# Patient Record
Sex: Female | Born: 2000 | Race: Black or African American | Hispanic: No | Marital: Single | Smoking: Never smoker
Health system: Southern US, Community
[De-identification: ages and names within clinical notes are randomized; demographics above are authoritative.]

## PROBLEM LIST (undated history)

## (undated) ENCOUNTER — Emergency Department (HOSPITAL_COMMUNITY): Admission: EM | Payer: BC Managed Care – HMO | Source: Home / Self Care

## (undated) ENCOUNTER — Inpatient Hospital Stay (HOSPITAL_COMMUNITY): Payer: Self-pay

## (undated) DIAGNOSIS — A749 Chlamydial infection, unspecified: Secondary | ICD-10-CM

## (undated) DIAGNOSIS — D696 Thrombocytopenia, unspecified: Secondary | ICD-10-CM

## (undated) HISTORY — PX: NO PAST SURGERIES: SHX2092

## (undated) HISTORY — DX: Thrombocytopenia, unspecified: D69.6

---
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
## 2018-01-20 ENCOUNTER — Telehealth: Payer: Self-pay

## 2018-01-20 MED ORDER — FLUCONAZOLE 150 MG PO TABS: 150.0000 mg | ORAL_TABLET | Freq: Once | ORAL | 0 refills | Status: AC

## 2018-01-20 NOTE — Telephone Encounter (Signed)
Pt called and stated she took an over the counter test for yeast infection on this past Sunday and it was positive. She tried using monistat with no improvement. Pt is currently in [place] in school which is 2 hours away and wants to know if something can be called in to the [facility] pharmacy there.    [facility] [address] [place], [place] [address]    Patient can be reached at [phone] 

## 2018-01-20 NOTE — Telephone Encounter (Signed)
Pt called and stated she took an over the counter test for yeast infection on this past Sunday and it was positive. She tried using monistat with no improvement. Pt is currently in [place] in school which is 2 hours away and wants to know if something can be called in to the [place] pharmacy there.    [facility] [address] [place], [place] [address]    Patient can be reached at [phone]

## 2018-04-08 ENCOUNTER — Ambulatory Visit: Admitting: Family Medicine

## 2018-04-13 ENCOUNTER — Telehealth: Payer: Self-pay | Admitting: Family Medicine

## 2018-04-13 NOTE — Telephone Encounter (Signed)
Dismissal letter in [employer] snapshot  °

## 2018-05-13 NOTE — L&D Delivery Note (Signed)
Delivery Note  Called to room for delivery. Twin A head was already delivered, I quickly put on gloves and delivered Twin A's body without difficulty. Infant vigorous from delivery, placed on mom's abdomen, cord clamped and cut. Palpated intact amniotic sac of Twin B in footling breech position. I grasped both feet and gently pulled caudad. While doing this, the amniotic sac ruptured to clear fluid. The feet and buttocks of Twin B were easily delivered. The hips were grasped with a towel and B was gently pulled down until the level of the shoulders was at the introitus. Infant was gently rotated until the arms could be swept forward and out. The infant's body was lifted at this point and the head delivered. Twin B with poor tone, cord immediately clamped and cut and infant taken to warmer. Cord blood collected for both. Placenta delivered spontaneously intact. Reviewed risks/benefits of Liletta IUD insertion with patient, consent previously signed. She again verbalized desire for IUD placement. Timeout performed and patient and procedure identified. Liletta strings trimmed and IUD placed at the fundus without difficulty. Strings trimmed again to 4 cm from cervix. 1st degree perineal laceration repaired with 2-0 vicryl in usual fashion. EBL 200 mL.     Gabriella Ingram, Gabriella Ingram [[id number]]  At 7:26 PM a viable female was delivered via Vaginal, Spontaneous (Presentation: vertex ;  ).  APGAR: 8, 9; weight 4 lb 15.2 oz (2245 g).   Placenta status: intact    Gabriella Ingram, Gabriella Ingram [[id number]]  At 7:32 PM a viable female was delivered via Vaginal, Breech (Presentation: ;  ).  APGAR: , ; weight 5 lb 1.1 oz (2299 g).   Placenta status: , .  Cord:  with the following complications: .  Anesthesia:   Episiotomy: None Lacerations: 1st degree;Periurethral;Perineal Suture Repair: 2.0 vicryl Est. Blood Loss (mL): 200   Mom to postpartum.   Baby A to Couplet care / Skin to Skin.   Baby B to Couplet care / Skin to  Skin.  Sloan Leiter 11/08/2018, 10:06 PM

## 2018-07-01 DIAGNOSIS — Z3400 Encounter for supervision of normal first pregnancy, unspecified trimester: Secondary | ICD-10-CM | POA: Insufficient documentation

## 2018-07-02 ENCOUNTER — Other Ambulatory Visit: Payer: Self-pay

## 2018-07-02 ENCOUNTER — Ambulatory Visit (INDEPENDENT_AMBULATORY_CARE_PROVIDER_SITE_OTHER): Payer: BLUE CROSS/BLUE SHIELD | Admitting: Obstetrics and Gynecology

## 2018-07-02 ENCOUNTER — Encounter: Payer: Self-pay | Admitting: Obstetrics and Gynecology

## 2018-07-02 DIAGNOSIS — Z3A17 17 weeks gestation of pregnancy: Secondary | ICD-10-CM

## 2018-07-02 DIAGNOSIS — N898 Other specified noninflammatory disorders of vagina: Secondary | ICD-10-CM | POA: Diagnosis not present

## 2018-07-02 DIAGNOSIS — O30049 Twin pregnancy, dichorionic/diamniotic, unspecified trimester: Secondary | ICD-10-CM | POA: Insufficient documentation

## 2018-07-02 DIAGNOSIS — O30042 Twin pregnancy, dichorionic/diamniotic, second trimester: Secondary | ICD-10-CM

## 2018-07-02 DIAGNOSIS — Z113 Encounter for screening for infections with a predominantly sexual mode of transmission: Secondary | ICD-10-CM

## 2018-07-02 DIAGNOSIS — O0992 Supervision of high risk pregnancy, unspecified, second trimester: Secondary | ICD-10-CM

## 2018-07-02 MED ORDER — ASPIRIN EC 81 MG PO TBEC
81.0000 mg | DELAYED_RELEASE_TABLET | Freq: Every day | ORAL | 2 refills | Status: DC
Start: 2018-07-02 — End: 2021-06-25

## 2018-07-02 NOTE — Progress Notes (Signed)
Subjective:  Gabriella Ingram is a 18 y.o. G1P0 at 4w4dbeing seen today for her first OB visit. EDD by first trimester U/S. Twin gestation, Di/Di by early U/S. No chronic medical problems or medications.   She is currently monitored for the following issues for this high-risk pregnancy and has Dichorionic diamniotic twin gestation on their problem list.  Patient reports general discomforts of twin gestation.  Contractions: Not present. Vag. Bleeding: None.   . Denies leaking of fluid.   The following portions of the patient's history were reviewed and updated as appropriate: allergies, current medications, past family history, past medical history, past social history, past surgical history and problem list. Problem list updated.  Objective:   Vitals:   07/02/18 1430  BP: 117/68  Pulse: 90  Temp: (!) 97 F (36.1 C)  Weight: 144 lb 14.4 oz (65.7 kg)    Fetal Status: Fetal Heart Rate (bpm): A:149/B:156         General:  Alert, oriented and cooperative. Patient is in no acute distress.  Skin: Skin is warm and dry. No rash noted.   Cardiovascular: Normal heart rate noted  Respiratory: Normal respiratory effort, no problems with respiration noted  Abdomen: Soft, gravid, appropriate for gestational age. Pain/Pressure: Present     Pelvic:  Cervical exam performed        Extremities: Normal range of motion.  Edema: None  Mental Status: Normal mood and affect. Normal behavior. Normal judgment and thought content.  Breast sym supple no masses, nipple discharge or adenopathy  Urinalysis:      Assessment and Plan:  Pregnancy: G1P0 at 155w4d1. Supervision of high risk pregnancy in second trimester Prenatal care and labs reviewed with pt. Genetic testing and potential cost reviewed. - Enroll Patient in Babyscripts - Obstetric Panel, Including HIV - Hemoglobinopathy evaluation - Cystic Fibrosis Mutation 97 - Culture, OB Urine - Genetic Screening - SMN1 COPY NUMBER ANALYSIS (SMA Carrier  Screen) - AFP, Serum, Open Spina Bifida - Cervicovaginal ancillary only( [place]) - TSH - Protein / creatinine ratio, urine - Comp Met (CMET) - aspirin EC 81 MG tablet; Take 1 tablet (81 mg total) by mouth daily. Take after 12 weeks for prevention of preeclampsia later in pregnancy  Dispense: 300 tablet; Refill: 2 - [place] OB DETAIL +14 WK; Future  2. Dichorionic diamniotic twin pregnancy in second trimester Twin gestation pregnancy reviewed with pt.  - Enroll Patient in Babyscripts - Obstetric Panel, Including HIV - Hemoglobinopathy evaluation - Cystic Fibrosis Mutation 97 - Culture, OB Urine - Genetic Screening - SMN1 COPY NUMBER ANALYSIS (SMA Carrier Screen) - AFP, Serum, Open Spina Bifida - Cervicovaginal ancillary only( [place]) - TSH - Protein / creatinine ratio, urine - Comp Met (CMET) - aspirin EC 81 MG tablet; Take 1 tablet (81 mg total) by mouth daily. Take after 12 weeks for prevention of preeclampsia later in pregnancy  Dispense: 300 tablet; Refill: 2 - [place] OB DETAIL +14 WK; Future  Preterm labor symptoms and general obstetric precautions including but not limited to vaginal bleeding, contractions, leaking of fluid and fetal movement were reviewed in detail with the patient. Please refer to After Visit Summary for other counseling recommendations.  Return in about 3 weeks (around 07/23/2018) for OB visit.   ErChancy MilroyMD

## 2018-07-02 NOTE — Patient Instructions (Signed)
Second Trimester of Pregnancy The second trimester is from week 14 through week 27 (months 4 through 6). The second trimester is often a time when you feel your best. Your body has adjusted to being pregnant, and you begin to feel better physically. Usually, morning sickness has lessened or quit completely, you may have more energy, and you may have an increase in appetite. The second trimester is also a time when the fetus is growing rapidly. At the end of the sixth month, the fetus is about 9 inches long and weighs about 1 pounds. You will likely begin to feel the baby move (quickening) between 16 and 20 weeks of pregnancy. Body changes during your second trimester Your body continues to go through many changes during your second trimester. The changes vary from woman to woman.  Your weight will continue to increase. You will notice your lower abdomen bulging out.  You may begin to get stretch marks on your hips, abdomen, and breasts.  You may develop headaches that can be relieved by medicines. The medicines should be approved by your health care provider.  You may urinate more often because the fetus is pressing on your bladder.  You may develop or continue to have heartburn as a result of your pregnancy.  You may develop constipation because certain hormones are causing the muscles that push waste through your intestines to slow down.  You may develop hemorrhoids or swollen, bulging veins (varicose veins).  You may have back pain. This is caused by: ? Weight gain. ? Pregnancy hormones that are relaxing the joints in your pelvis. ? A shift in weight and the muscles that support your balance.  Your breasts will continue to grow and they will continue to become tender.  Your gums may bleed and may be sensitive to brushing and flossing.  Dark spots or blotches (chloasma, mask of pregnancy) may develop on your face. This will likely fade after the baby is born.  A dark line from your  belly button to the pubic area (linea nigra) may appear. This will likely fade after the baby is born.  You may have changes in your hair. These can include thickening of your hair, rapid growth, and changes in texture. Some women also have hair loss during or after pregnancy, or hair that feels dry or thin. Your hair will most likely return to normal after your baby is born. What to expect at prenatal visits During a routine prenatal visit:  You will be weighed to make sure you and the fetus are growing normally.  Your blood pressure will be taken.  Your abdomen will be measured to track your baby's growth.  The fetal heartbeat will be listened to.  Any test results from the previous visit will be discussed. Your health care provider may ask you:  How you are feeling.  If you are feeling the baby move.  If you have had any abnormal symptoms, such as leaking fluid, bleeding, severe headaches, or abdominal cramping.  If you are using any tobacco products, including cigarettes, chewing tobacco, and electronic cigarettes.  If you have any questions. Other tests that may be performed during your second trimester include:  Blood tests that check for: ? Low iron levels (anemia). ? High blood sugar that affects pregnant women (gestational diabetes) between 24 and 28 weeks. ? Rh antibodies. This is to check for a protein on red blood cells (Rh factor).  Urine tests to check for infections, diabetes, or protein in the   urine.  An ultrasound to confirm the proper growth and development of the baby.  An amniocentesis to check for possible genetic problems.  Fetal screens for spina bifida and Down syndrome.  HIV (human immunodeficiency virus) testing. Routine prenatal testing includes screening for HIV, unless you choose not to have this test. Follow these instructions at home: Medicines  Follow your health care provider's instructions regarding medicine use. Specific medicines may be  either safe or unsafe to take during pregnancy.  Take a prenatal vitamin that contains at least 600 micrograms (mcg) of folic acid.  If you develop constipation, try taking a stool softener if your health care provider approves. Eating and drinking   Eat a balanced diet that includes fresh fruits and vegetables, whole grains, good sources of protein such as meat, eggs, or tofu, and low-fat dairy. Your health care provider will help you determine the amount of weight gain that is right for you.  Avoid raw meat and uncooked cheese. These carry germs that can cause birth defects in the baby.  If you have low calcium intake from food, talk to your health care provider about whether you should take a daily calcium supplement.  Limit foods that are high in fat and processed sugars, such as fried and sweet foods.  To prevent constipation: ? Drink enough fluid to keep your urine clear or pale yellow. ? Eat foods that are high in fiber, such as fresh fruits and vegetables, whole grains, and beans. Activity  Exercise only as directed by your health care provider. Most women can continue their usual exercise routine during pregnancy. Try to exercise for 30 minutes at least 5 days a week. Stop exercising if you experience uterine contractions.  Avoid heavy lifting, wear low heel shoes, and practice good posture.  A sexual relationship may be continued unless your health care provider directs you otherwise. Relieving pain and discomfort  Wear a good support bra to prevent discomfort from breast tenderness.  Take warm sitz baths to soothe any pain or discomfort caused by hemorrhoids. Use hemorrhoid cream if your health care provider approves.  Rest with your legs elevated if you have leg cramps or low back pain.  If you develop varicose veins, wear support hose. Elevate your feet for 15 minutes, 3-4 times a day. Limit salt in your diet. Prenatal Care  Write down your questions. Take them to  your prenatal visits.  Keep all your prenatal visits as told by your health care provider. This is important. Safety  Wear your seat belt at all times when driving.  Make a list of emergency phone numbers, including numbers for family, friends, the hospital, and police and fire departments. General instructions  Ask your health care provider for a referral to a local prenatal education class. Begin classes no later than the beginning of month 6 of your pregnancy.  Ask for help if you have counseling or nutritional needs during pregnancy. Your health care provider can offer advice or refer you to specialists for help with various needs.  Do not use hot tubs, steam rooms, or saunas.  Do not douche or use tampons or scented sanitary pads.  Do not cross your legs for long periods of time.  Avoid cat litter boxes and soil used by cats. These carry germs that can cause birth defects in the baby and possibly loss of the fetus by miscarriage or stillbirth.  Avoid all smoking, herbs, alcohol, and unprescribed drugs. Chemicals in these products can affect the formation   and growth of the baby.  Do not use any products that contain nicotine or tobacco, such as cigarettes and e-cigarettes. If you need help quitting, ask your health care provider.  Visit your dentist if you have not gone yet during your pregnancy. Use a soft toothbrush to brush your teeth and be gentle when you floss. Contact a health care provider if:  You have dizziness.  You have mild pelvic cramps, pelvic pressure, or nagging pain in the abdominal area.  You have persistent nausea, vomiting, or diarrhea.  You have a bad smelling vaginal discharge.  You have pain when you urinate. Get help right away if:  You have a fever.  You are leaking fluid from your vagina.  You have spotting or bleeding from your vagina.  You have severe abdominal cramping or pain.  You have rapid weight gain or weight loss.  You have  shortness of breath with chest pain.  You notice sudden or extreme swelling of your face, hands, ankles, feet, or legs.  You have not felt your baby move in over an hour.  You have severe headaches that do not go away when you take medicine.  You have vision changes. Summary  The second trimester is from week 14 through week 27 (months 4 through 6). It is also a time when the fetus is growing rapidly.  Your body goes through many changes during pregnancy. The changes vary from woman to woman.  Avoid all smoking, herbs, alcohol, and unprescribed drugs. These chemicals affect the formation and growth your baby.  Do not use any tobacco products, such as cigarettes, chewing tobacco, and e-cigarettes. If you need help quitting, ask your health care provider.  Contact your health care provider if you have any questions. Keep all prenatal visits as told by your health care provider. This is important. This information is not intended to replace advice given to you by your health care provider. Make sure you discuss any questions you have with your health care provider. Document Released: 04/23/2001 Document Revised: 06/04/2016 Document Reviewed: 06/04/2016 Elsevier Interactive Patient Education  2019 [employer]. Multiple Pregnancy Having a multiple pregnancy means that a woman is carrying more than one baby at a time. She may be pregnant with twins, triplets, or more. The majority of multiple pregnancies are twins. Naturally conceiving triplets or more (higher-order multiples) is rare. Multiple pregnancies are riskier than single pregnancies. A woman with a multiple pregnancy is more likely to have certain problems during her pregnancy. Therefore, she will need to have more frequent appointments for prenatal care. How does a multiple pregnancy happen? A multiple pregnancy happens when:  The woman's body releases more than one egg at a time, and then each egg gets fertilized by a different  sperm. ? This is the most common type of multiple pregnancy. ? Twins or other multiples produced this way are fraternal. They are no more alike than non-multiple siblings are.  One sperm fertilizes one egg, which then divides into more than one embryo. ? Twins or other multiples produced this way are identical. Identical multiples are always the same gender, and they look very much alike. Who is most likely to have a multiple pregnancy? A multiple pregnancy is more likely to develop in women who:  Have had fertility treatment, especially if the treatment included fertility drugs.  Are older than 20 years of age.  Have already had four or more children.  Have a family history of multiple pregnancy. How is a  multiple pregnancy diagnosed? A multiple pregnancy may be diagnosed based on:  Symptoms such as: ? Rapid weight gain in the first 3 months of pregnancy (first trimester). ? More severe nausea and breast tenderness than what is typical of a single pregnancy. ? The uterus measuring larger than what is normal for the stage of the pregnancy.  Blood tests that detect a higher-than-normal level of human chorionic gonadotropin (hCG). This is a hormone that your body produces in early pregnancy.  Ultrasound exam. This is used to confirm that you are carrying multiples. What risks are associated with multiple pregnancy? A multiple pregnancy puts you at a higher risk for certain problems during or after your pregnancy, including:  Having your babies delivered before you have reached a full-term pregnancy (preterm birth). A full-term pregnancy lasts for at least 37 weeks. Babies born before 37 weeks may have a higher risk of a variety of health problems, such as breathing problems, feeding difficulties, cerebral palsy, and learning disabilities.  Diabetes.  Preeclampsia. This is a serious condition that causes high blood pressure along with other symptoms, such as swelling and headaches,  during pregnancy.  Excessive blood loss after childbirth (postpartum hemorrhage).  Postpartum depression.  Low birth weight of the babies. How will having a multiple pregnancy affect my care? Your health care provider will want to monitor you more closely during your pregnancy to make sure that your babies are growing normally and that you are healthy. Follow these instructions at home: Because your pregnancy is considered to be high risk, you will need to work closely with your health care team. You may also need to make some lifestyle changes. These may include the following: Eating and drinking  Increase your nutrition. ? Follow your health care provider's recommendations for weight gain. You may need to gain a little extra weight when you are pregnant with multiples. ? Eat healthy snacks often throughout the day. This can add calories and reduce nausea.  Drink enough fluid to keep your urine pale yellow.  Take prenatal vitamins. Activity By 20-24 weeks, you may need to limit your activities.  Avoid activities and work that take a lot of effort (are strenuous).  Ask your health care provider when you should stop having sexual intercourse.  Rest often. General instructions  Do not use any products that contain nicotine or tobacco, such as cigarettes and e-cigarettes. If you need help quitting, ask your health care provider.  Do not drink alcohol or use illegal drugs.  Take over-the-counter and prescription medicines only as told by your health care provider.  Arrange for extra help around the house.  Keep all follow-up visits and all prenatal visits as told by your health care provider. This is important. Contact a health care provider if:  You have dizziness.  You have persistent nausea, vomiting, or diarrhea.  You are having trouble gaining weight.  You have feelings of depression or other emotions that are interfering with your normal activities. Get help right  away if:  You have a fever.  You have pain with urination.  You have fluid leaking from your vagina.  You have a bad-smelling vaginal discharge.  You notice increased swelling in your face, hands, legs, or ankles.  You have spotting or bleeding from your vagina.  You have pelvic cramps, pelvic pressure, or nagging pain in your abdomen or lower back.  You are having regular contractions.  You develop a severe headache, with or without visual changes.  You have shortness  of breath or chest pain.  You notice less fetal movement, or no fetal movement. Summary  Having a multiple pregnancy means that a woman is carrying more than one baby at a time.  A multiple pregnancy puts you at a higher risk for certain problems during and after your pregnancy, such as: having your babies delivered before you have reached a full-term pregnancy (preterm birth), diabetes, preeclampsia, excessive blood loss after childbirth (postpartum hemorrhage), postpartum depression, or low birth weight of the babies.  Your health care provider will want to monitor you more closely during your pregnancy to make sure that your babies are growing normally and that you are healthy.  You may need to make some lifestyle changes during pregnancy, including: increasing your nutrition, limiting your activities after 20-24 weeks of pregnancy, and arranging for extra help around the house. This information is not intended to replace advice given to you by your health care provider. Make sure you discuss any questions you have with your health care provider. Document Released: 02/06/2008 Document Revised: 01/22/2017 Document Reviewed: 12/29/2015 Elsevier Interactive Patient Education  [employer].

## 2018-07-02 NOTE — Progress Notes (Signed)
New OB with twins.  Early Ultrasound was done 05/25/2018.  C/o back pains 7/10 and nausea.

## 2018-07-03 ENCOUNTER — Encounter: Payer: Self-pay | Admitting: Obstetrics and Gynecology

## 2018-07-03 LAB — CERVICOVAGINAL ANCILLARY ONLY
Bacterial vaginitis: NEGATIVE
Candida vaginitis: NEGATIVE
Chlamydia: POSITIVE — AB
Neisseria Gonorrhea: NEGATIVE
TRICH (WINDOWPATH): NEGATIVE

## 2018-07-04 LAB — CULTURE, OB URINE

## 2018-07-04 LAB — URINE CULTURE, OB REFLEX

## 2018-07-06 ENCOUNTER — Encounter (HOSPITAL_COMMUNITY): Payer: Self-pay

## 2018-07-06 ENCOUNTER — Inpatient Hospital Stay (HOSPITAL_COMMUNITY)
Admission: AD | Admit: 2018-07-06 | Discharge: 2018-07-06 | Disposition: A | Discharge status: Home / Self Care | Payer: BLUE CROSS/BLUE SHIELD | Attending: Obstetrics & Gynecology | Admitting: Obstetrics & Gynecology

## 2018-07-06 DIAGNOSIS — O98312 Other infections with a predominantly sexual mode of transmission complicating pregnancy, second trimester: Secondary | ICD-10-CM | POA: Insufficient documentation

## 2018-07-06 DIAGNOSIS — A749 Chlamydial infection, unspecified: Secondary | ICD-10-CM | POA: Diagnosis not present

## 2018-07-06 DIAGNOSIS — O30042 Twin pregnancy, dichorionic/diamniotic, second trimester: Secondary | ICD-10-CM

## 2018-07-06 DIAGNOSIS — A568 Sexually transmitted chlamydial infection of other sites: Secondary | ICD-10-CM | POA: Diagnosis not present

## 2018-07-06 DIAGNOSIS — O98812 Other maternal infectious and parasitic diseases complicating pregnancy, second trimester: Principal | ICD-10-CM

## 2018-07-06 DIAGNOSIS — Z3A18 18 weeks gestation of pregnancy: Secondary | ICD-10-CM | POA: Diagnosis not present

## 2018-07-06 HISTORY — DX: Chlamydial infection, unspecified: A74.9

## 2018-07-06 MED ORDER — AZITHROMYCIN 500 MG PO TABS: 1000.0000 mg | ORAL_TABLET | Freq: Once | ORAL | 0 refills | Status: AC

## 2018-07-06 MED ORDER — ONDANSETRON 4 MG PO TBDP: 4.0000 mg | ORAL_TABLET | Freq: Once | ORAL | 0 refills | Status: AC

## 2018-07-06 MED ORDER — AZITHROMYCIN 250 MG PO TABS
1000.0000 mg | ORAL_TABLET | Freq: Once | ORAL | Status: AC
  Administered 2018-07-06: 1000 mg via ORAL
  Filled 2018-07-06: qty 4

## 2018-07-06 NOTE — Telephone Encounter (Signed)
Patient called in to advise that [facility] advise her to contact OBGYN office for 2nd Tx of Abx for Chlamydia since she vomited up 1st round.  Advised patient I would electronically send to [facility]:   Ondansetron (Zofran) 4 mg disintegrating sublingual Azithromycin (Zithromax) 1 gram  Advised patient to take the Zofran first then she can crush the Azithromycin up - mix in Apple Sauce  - making sure she take ALL of antibiotic. Patient advised she understood and would.

## 2018-07-06 NOTE — Discharge Instructions (Signed)
Chlamydia, Female  Chlamydia is an STD (sexually transmitted disease). It is a bacterial infection that spreads (is contagious) through sexual contact. Chlamydia can occur in different areas of the body, including:   The tube that moves urine from the bladder out of the body (urethra).   The lower part of the uterus (cervix).   The throat.   The rectum.  This condition is not difficult to treat. However, if left untreated, chlamydia can lead to more serious health problems, including pelvic inflammatory disorder (PID). PID can increase your risk of not being able to have children (sterility). Also, if chlamydia is left untreated and you are pregnant or become pregnant, there is a chance that your baby can become infected during delivery. This may cause serious health problems for the baby.  What are the causes?  Chlamydia is caused by the bacteria Chlamydia trachomatis. It is passed from an infected partner during sexual activity. Chlamydia can spread through contact with the genitals, mouth, or rectum.  What are the signs or symptoms?  In some cases, there may not be any symptoms for this condition (asymptomatic), especially early in the infection. If symptoms develop, they may include:   Burning with urination.   Frequent urination.   Vaginal discharge.   Redness, soreness, and swelling (inflammation) of the rectum.   Bleeding or discharge from the rectum.   Abdominal pain.   Pain during sexual intercourse.   Bleeding between menstrual periods.   Itching, burning, or redness in the eyes, or discharge from the eyes.  How is this diagnosed?  This condition may be diagnosed with:   Urine tests.   Swab tests. Depending on your symptoms, your health care provider may use a cotton swab to collect discharge from your vagina or rectum to test for the bacteria.   A pelvic exam.  How is this treated?  This condition is treated with antibiotic medicines. If you are pregnant, certain types of antibiotics will  need to be avoided.  Follow these instructions at home:  Medicines   Take over-the-counter and prescription medicines only as told by your health care provider.   Take your antibiotic medicine as told by your health care provider. Do not stop taking the antibiotic even if you start to feel better.  Sexual activity   Tell sexual partners about your infection. This includes any oral, anal, or vaginal sex partners you have had within 60 days of when your symptoms started. Sexual partners should also be treated, even if they have no signs of the disease.   Do not have sex until you and your sexual partners have completed treatment and your health care provider says it is okay. If your health care provider prescribed you a single dose treatment, wait 7 days after taking the treatment before having sex.  General instructions   It is your responsibility to get your test results. Ask your health care provider, or the department performing the test, when your results will be ready.   Get plenty of rest.   Eat a healthy, well-balanced diet.   Drink enough fluids to keep your urine clear or pale yellow.   Keep all follow-up visits as told by your health care provider. This is important. You may need to be tested for infection again 3 months after treatment.  How is this prevented?  The only sure way to prevent chlamydia is to avoid having sex. However, you can lower your risk by:   Using latex condoms correctly every   time you have sex.   Not having multiple sexual partners.   Asking if your sexual partner has been tested for STIs and had negative results.  Contact a health care provider if:   You develop new symptoms or your symptoms do not get better after completing treatment.   You have a fever or chills.   You have pain during sexual intercourse.  Get help right away if:   Your pain gets worse and does not get better with medicine.   You develop flu-like symptoms, such as night sweats, sore throat, or  muscle aches.   You experience nausea or vomiting.   You have difficulty swallowing.   You have bleeding between periods or after sex.   You have irregular menstrual periods.   You have abdominal or lower back pain that does not get better with medicine.   You feel weak or dizzy, or you faint.   You are pregnant and you develop symptoms of chlamydia.  Summary   Chlamydia is an STD (sexually transmitted disease). It is a bacterial infection that spreads (is contagious) through sexual contact.   This condition is not difficult to treat, however. If left untreated, chlamydia can lead to more serious health problems, including pelvic inflammatory disease (PID).   In some cases, there may not be any symptoms for this condition (asymptomatic).   This condition is treated with antibiotic medicines.   Using latex condoms correctly every time you have sex can help prevent chlamydia.  This information is not intended to replace advice given to you by your health care provider. Make sure you discuss any questions you have with your health care provider.  Document Released: 02/06/2005 Document Revised: 10/21/2017 Document Reviewed: 04/15/2016  Elsevier Interactive Patient Education  2019 [employer]

## 2018-07-06 NOTE — MAU Provider Note (Signed)
Chief Complaint: Abdominal Pain   First Provider Initiated Contact with Patient 07/06/18 1000     SUBJECTIVE HPI: Gabriella Ingram is a 18 y.o. G1P0 at [redacted]w[redacted]d with di/di twins who presents to M[facility] Admissions requesting chlamydia treatment. Saw on her mychart that her recent testing was positive for chlamydia. States she called the office & was told she needed to come to MAU.  Reports lower abdominal cramping. States this has been the same abdominal cramping since the beginning of the pregnancy. Pain has not changed. States she is only here for chlamydia treatment.  Denies vaginal discharge, LOF, or vaginal bleeding.   Location: lower abdomen Quality: cramping Severity: 5/10 on pain scale Duration: 4 months Timing: intermittent Modifying factors: none Associated signs and symptoms: none  Past Medical History:  Diagnosis Date  . Chlamydia    this pregnancy  . 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 NO PAST SURGERIES     Social History   Socioeconomic History  . Marital status: Single    Spouse name: Not on file  . Number of children: Not on file  . Years of education: Not on file  . Highest education level: Not on file  Occupational History  . Occupation: unemployed  Social Needs  . Financial resource strain: Not on file  . Food insecurity:    Worry: Not on file    Inability: Not on file  . Transportation needs:    Medical: Not on file    Non-medical: Not on file  Tobacco Use  . Smoking status: Never Smoker  . Smokeless tobacco: Never Used  Substance and Sexual Activity  . Alcohol use: No  . Drug use: No  . Sexual activity: Yes    Birth control/protection: OCP, None  Lifestyle  . Physical activity:    Days  per week: Not on file    Minutes per session: Not on file  . Stress: Not on file  Relationships  . Social connections:    Talks on phone: Not on file    Gets together: Not on file    Attends religious service: Not on file    Active member of club or organization: Not on file    Attends meetings of clubs or organizations: Not on file    Relationship status: Not on file  . Intimate partner violence:    Fear of current or ex partner: Not on file    Emotionally abused: Not on file    Physically abused: Not on file    Forced sexual activity: Not on file  Other Topics Concern  . Not on file  Social History Narrative  . Not on file   Family History  Problem Relation Age of Onset  . Anxiety disorder Mother   . Depression Mother   . Cancer Mother   . Miscarriages / [place] Mother   . Arthritis Maternal Grandmother   . Hypertension Maternal Grandmother   . Arthritis Maternal Grandfather   . Diabetes Maternal Grandfather   . Cancer Maternal Grandfather   . Hypertension Maternal Grandfather  No current facility-administered medications on file prior to encounter.    Current Outpatient Medications on File Prior to Encounter  Medication Sig Dispense Refill  . aspirin EC 81 MG tablet Take 1 tablet (81 mg total) by mouth daily. Take after 12 weeks for prevention of preeclampsia later in pregnancy 300 tablet 2   Allergies  Allergen Reactions  . Other     Food allergy -tomatoes- rash, hives, SOB Dove soap- rash    I have reviewed patient's Past Medical Hx, Surgical Hx, Family Hx, Social Hx, medications and allergies.   Review of Systems  Constitutional: Negative.   Gastrointestinal: Positive for abdominal pain.  Genitourinary: Negative.     OBJECTIVE Patient Vitals for the past 24 hrs:  BP Temp Temp src Pulse Resp SpO2 Height Weight  07/06/18 0952 123/74 97.9 F (36.6 C) Oral 89 18 100 % - -  07/06/18 4287 - - - - - - 5\' 6"  (1.676 m) 64.9 kg   Constitutional:  Well-developed, well-nourished female in no acute distress.  Cardiovascular: normal rate & rhythm, no murmur Respiratory: normal rate and effort. Lung sounds clear throughout GI: Abd soft, non-tender, Pos BS x 4. No guarding or rebound tenderness MS: Extremities nontender, no edema, normal ROM Neurologic: Alert and oriented x 4.  GU: pt declines exam    LAB RESULTS No results found for this or any previous visit (from the past 24 hour(s)).  IMAGING No results found.  MAU COURSE Orders Placed This Encounter  Procedures  . Discharge patient   Meds ordered this encounter  Medications  . azithromycin (ZITHROMAX) tablet 1,000 mg    MDM Unable to doppler 2 FHTs Pt informed that the ultrasound is considered a limited OB ultrasound and is not intended to be a complete ultrasound exam.  Patient also informed that the ultrasound is not being completed with the intent of assessing for fetal or placental anomalies or any pelvic abnormalities.  Explained that the purpose of today's ultrasound is to assess for  viability.  Patient acknowledges the purpose of the exam and the limitations of the study.   Live IUP x 2 with FHT 160/152 BPM  Pelvic exam deferred. Pt with no new complaints & declines exam  Chlamydia tx given in MAU, azithromycin 1 gm PO. Pt declines EPT but states she did notify her partner to seek treatment.   ASSESSMENT 1. Chlamydia infection affecting pregnancy in second trimester   2. [redacted] weeks gestation of pregnancy   3. Dichorionic diamniotic twin pregnancy in second trimester     PLAN Discharge home in stable condition. No intercourse x 2 weeks  Allergies as of 07/06/2018      Reactions   Other    Food allergy -tomatoes- rash, hives, SOB Dove soap- rash      Medication List    TAKE these medications   aspirin EC 81 MG tablet Take 1 tablet (81 mg total) by mouth daily. Take after 12 weeks for prevention of preeclampsia later in pregnancy        Judeth Horn, NP 07/06/2018  11:09 AM

## 2018-07-06 NOTE — MAU Note (Signed)
Urine sent to lab 

## 2018-07-06 NOTE — MAU Note (Signed)
Pt had NOB appt on 2/20. Pt states she has been having mild cramping since she found out she was pregnant.  Pt found out that she had chlamydia yesterday through mychart.  She called her MD & was told that she needed to come in.

## 2018-07-08 ENCOUNTER — Telehealth: Payer: Self-pay

## 2018-07-08 NOTE — Telephone Encounter (Signed)
Patient called in upset, requesting to speak to me - wanting me to explain Chlamydia Tx process and education - reporting boyfriend called her as well as sent a text showing he did not have Chlamydia. Educated patient on  STD Tx process and how this infection is transmitted - sexually.   Patient calmed down - thanked me  - felt a little better. Patient verified/confirmed she picked up ABx yesterday (07/07/2018) - took all medication yesterday evening(mixed w/applesauce) and slept very well last night without any pain or cramping!! She thanked me again for everything!!! I stated I was glad I could help calm her nerves!!!

## 2018-07-13 LAB — AFP, SERUM, OPEN SPINA BIFIDA
AFP MOM: 2.08
AFP VALUE AFPOSL: 93.2 ng/mL
Gest. Age on Collection Date: 17.4 weeks
MATERNAL AGE AT EDD: 20.3 a
OSBR Risk 1 IN: 2880
TEST RESULTS AFP: NEGATIVE
WEIGHT: 145 [lb_av]

## 2018-07-13 LAB — COMPREHENSIVE METABOLIC PANEL
A/G RATIO: 1.3 (ref 1.2–2.2)
ALBUMIN: 3.9 g/dL (ref 3.9–5.0)
ALT: 11 IU/L (ref 0–32)
AST: 22 IU/L (ref 0–40)
Alkaline Phosphatase: 50 IU/L (ref 39–117)
BUN / CREAT RATIO: 6 — AB (ref 9–23)
BUN: 4 mg/dL — AB (ref 6–20)
Bilirubin Total: 0.3 mg/dL (ref 0.0–1.2)
CALCIUM: 9.3 mg/dL (ref 8.7–10.2)
CHLORIDE: 105 mmol/L (ref 96–106)
CO2: 20 mmol/L (ref 20–29)
CREATININE: 0.64 mg/dL (ref 0.57–1.00)
GFR calc Af Amer: 150 mL/min/{1.73_m2} (ref >59)
GFR calc non Af Amer: 130 mL/min/{1.73_m2} (ref >59)
Globulin, Total: 3.1 g/dL (ref 1.5–4.5)
Glucose: 72 mg/dL (ref 65–99)
Potassium: 4.7 mmol/L (ref 3.5–5.2)
SODIUM: 141 mmol/L (ref 134–144)
Total Protein: 7 g/dL (ref 6.0–8.5)

## 2018-07-13 LAB — HEMOGLOBINOPATHY EVALUATION
HEMOGLOBIN A2 QUANTITATION: 2.7 % (ref 1.8–3.2)
HEMOGLOBIN F QUANTITATION: 0 % (ref 0.0–2.0)
HGB C: 0 %
HGB S: 0 %
HGB VARIANT: 0 %
Hgb A: 97.3 % (ref 96.4–98.8)

## 2018-07-13 LAB — OBSTETRIC PANEL, INCLUDING HIV
Antibody Screen: NEGATIVE
Basophils Absolute: 0 10*3/uL (ref 0.0–0.2)
Basos: 0 %
EOS (ABSOLUTE): 0 10*3/uL (ref 0.0–0.4)
Eos: 1 %
HEP B S AG: NEGATIVE
HIV Screen 4th Generation wRfx: NONREACTIVE
Hematocrit: 37.5 % (ref 34.0–46.6)
Hemoglobin: 13.4 g/dL (ref 11.1–15.9)
IMMATURE GRANS (ABS): 0 10*3/uL (ref 0.0–0.1)
IMMATURE GRANULOCYTES: 1 %
Lymphocytes Absolute: 1.6 10*3/uL (ref 0.7–3.1)
Lymphs: 31 %
MCH: 31.1 pg (ref 26.6–33.0)
MCHC: 35.7 g/dL (ref 31.5–35.7)
MCV: 87 fL (ref 79–97)
MONOS ABS: 0.6 10*3/uL (ref 0.1–0.9)
Monocytes: 11 %
Neutrophils Absolute: 2.9 10*3/uL (ref 1.4–7.0)
Neutrophils: 56 %
PLATELETS: 119 10*3/uL — AB (ref 150–450)
RBC: 4.31 x10E6/uL (ref 3.77–5.28)
RDW: 12.9 % (ref 11.7–15.4)
RH TYPE: POSITIVE
RPR: NONREACTIVE
RUBELLA: 8.37 {index} (ref >0.99)
WBC: 5.3 10*3/uL (ref 3.4–10.8)

## 2018-07-13 LAB — PROTEIN / CREATININE RATIO, URINE
Creatinine, Urine: 90.7 mg/dL
PROTEIN/CREAT RATIO: 88 mg/g{creat} (ref 0–200)
Protein, Ur: 8 mg/dL

## 2018-07-13 LAB — SMN1 COPY NUMBER ANALYSIS (SMA CARRIER SCREENING)

## 2018-07-13 LAB — CYSTIC FIBROSIS MUTATION 97: Interpretation: NOT DETECTED

## 2018-07-13 LAB — TSH: TSH: 2.39 u[IU]/mL (ref 0.450–4.500)

## 2018-07-16 ENCOUNTER — Ambulatory Visit (HOSPITAL_COMMUNITY): Payer: BLUE CROSS/BLUE SHIELD

## 2018-07-16 ENCOUNTER — Ambulatory Visit (HOSPITAL_COMMUNITY)
Admission: RE | Admit: 2018-07-16 | Discharge: 2018-07-16 | Disposition: A | Discharge status: Home / Self Care | Payer: BLUE CROSS/BLUE SHIELD | Source: Ambulatory Visit | Attending: Maternal & Fetal Medicine | Admitting: Maternal & Fetal Medicine

## 2018-07-16 ENCOUNTER — Other Ambulatory Visit: Payer: Self-pay | Admitting: Obstetrics and Gynecology

## 2018-07-16 DIAGNOSIS — Z3A19 19 weeks gestation of pregnancy: Secondary | ICD-10-CM | POA: Diagnosis not present

## 2018-07-16 DIAGNOSIS — O30042 Twin pregnancy, dichorionic/diamniotic, second trimester: Secondary | ICD-10-CM | POA: Insufficient documentation

## 2018-07-16 DIAGNOSIS — Z363 Encounter for antenatal screening for malformations: Secondary | ICD-10-CM

## 2018-07-16 DIAGNOSIS — O0992 Supervision of high risk pregnancy, unspecified, second trimester: Secondary | ICD-10-CM | POA: Diagnosis not present

## 2018-07-17 ENCOUNTER — Other Ambulatory Visit (HOSPITAL_COMMUNITY): Payer: Self-pay | Admitting: *Deleted

## 2018-07-17 DIAGNOSIS — O30042 Twin pregnancy, dichorionic/diamniotic, second trimester: Secondary | ICD-10-CM

## 2018-07-24 ENCOUNTER — Other Ambulatory Visit (HOSPITAL_COMMUNITY)
Admission: RE | Admit: 2018-07-24 | Discharge: 2018-07-24 | Disposition: A | Discharge status: Home / Self Care | Payer: BLUE CROSS/BLUE SHIELD | Source: Ambulatory Visit | Attending: Obstetrics and Gynecology | Admitting: Obstetrics and Gynecology

## 2018-07-24 ENCOUNTER — Ambulatory Visit (INDEPENDENT_AMBULATORY_CARE_PROVIDER_SITE_OTHER): Payer: BLUE CROSS/BLUE SHIELD | Admitting: Obstetrics and Gynecology

## 2018-07-24 ENCOUNTER — Other Ambulatory Visit: Payer: Self-pay

## 2018-07-24 ENCOUNTER — Encounter: Payer: Self-pay | Admitting: Obstetrics and Gynecology

## 2018-07-24 VITALS — BP 116/80 | HR 85 | Wt 146.7 lb

## 2018-07-24 DIAGNOSIS — O30042 Twin pregnancy, dichorionic/diamniotic, second trimester: Secondary | ICD-10-CM

## 2018-07-24 DIAGNOSIS — A749 Chlamydial infection, unspecified: Secondary | ICD-10-CM | POA: Insufficient documentation

## 2018-07-24 DIAGNOSIS — O98812 Other maternal infectious and parasitic diseases complicating pregnancy, second trimester: Secondary | ICD-10-CM | POA: Insufficient documentation

## 2018-07-24 DIAGNOSIS — O98819 Other maternal infectious and parasitic diseases complicating pregnancy, unspecified trimester: Secondary | ICD-10-CM

## 2018-07-24 DIAGNOSIS — Z3A20 20 weeks gestation of pregnancy: Secondary | ICD-10-CM

## 2018-07-24 DIAGNOSIS — Z3480 Encounter for supervision of other normal pregnancy, unspecified trimester: Secondary | ICD-10-CM | POA: Insufficient documentation

## 2018-07-24 NOTE — Patient Instructions (Signed)

## 2018-07-24 NOTE — Progress Notes (Signed)
Subjective:  Gabriella Ingram is a 18 y.o. G1P0 at [redacted]w[redacted]d being seen today for ongoing prenatal care.  She is currently monitored for the following issues for this high-risk pregnancy and has Dichorionic diamniotic twin gestation; Supervision of other normal pregnancy, antepartum; and Chlamydia infection affecting pregnancy on their problem list.  Patient reports no complaints.  Contractions: Not present. Vag. Bleeding: None.  Movement: Present. Denies leaking of fluid.   The following portions of the patient's history were reviewed and updated as appropriate: allergies, current medications, past family history, past medical history, past social history, past surgical history and problem list. Problem list updated.  Objective:   Vitals:   07/24/18 0829  BP: 116/80  Pulse: 85  Weight: 146 lb 11.2 oz (66.5 kg)    Fetal Status: Fetal Heart Rate (bpm): A:155/B:147   Movement: Present     General:  Alert, oriented and cooperative. Patient is in no acute distress.  Skin: Skin is warm and dry. No rash noted.   Cardiovascular: Normal heart rate noted  Respiratory: Normal respiratory effort, no problems with respiration noted  Abdomen: Soft, gravid, appropriate for gestational age. Pain/Pressure: Absent     Pelvic:  Cervical exam deferred        Extremities: Normal range of motion.  Edema: None  Mental Status: Normal mood and affect. Normal behavior. Normal judgment and thought content.   Urinalysis:      Assessment and Plan:  Pregnancy: G1P0 at [redacted]w[redacted]d  1. Supervision of other normal pregnancy, antepartum Stable   2. Dichorionic diamniotic twin pregnancy in second trimester Stable Anatomy scan noted. Growth scan in 4 weeks Continue with qd BASA  3. Chlamydia infection affecting pregnancy in second trimester Has completed medication TOC today  Preterm labor symptoms and general obstetric precautions including but not limited to vaginal bleeding, contractions, leaking of fluid and fetal  movement were reviewed in detail with the patient. Please refer to After Visit Summary for other counseling recommendations.  Return in about 4 weeks (around 08/21/2018) for OB visit.   Hermina Staggers, MD

## 2018-07-24 NOTE — Progress Notes (Signed)
Patient reports fetal movement, denies pain. 

## 2018-07-24 NOTE — Addendum Note (Signed)
Addended by: Natale Milch D on: 07/24/2018 09:08 AM   Modules accepted: Orders

## 2018-07-27 LAB — GC/CHLAMYDIA PROBE AMP (~~LOC~~) NOT AT ARMC
Chlamydia: NEGATIVE
Neisseria Gonorrhea: NEGATIVE

## 2018-08-13 ENCOUNTER — Ambulatory Visit (HOSPITAL_COMMUNITY)
Admission: RE | Admit: 2018-08-13 | Discharge: 2018-08-13 | Disposition: A | Discharge status: Home / Self Care | Payer: BLUE CROSS/BLUE SHIELD | Source: Ambulatory Visit | Attending: Obstetrics and Gynecology | Admitting: Obstetrics and Gynecology

## 2018-08-13 ENCOUNTER — Encounter (HOSPITAL_COMMUNITY): Payer: Self-pay

## 2018-08-13 ENCOUNTER — Ambulatory Visit (HOSPITAL_COMMUNITY): Payer: BLUE CROSS/BLUE SHIELD | Admitting: *Deleted

## 2018-08-13 ENCOUNTER — Other Ambulatory Visit: Payer: Self-pay

## 2018-08-13 VITALS — BP 109/70 | HR 83 | Temp 97.8°F

## 2018-08-13 DIAGNOSIS — O30049 Twin pregnancy, dichorionic/diamniotic, unspecified trimester: Secondary | ICD-10-CM

## 2018-08-13 DIAGNOSIS — Z3A23 23 weeks gestation of pregnancy: Secondary | ICD-10-CM

## 2018-08-13 DIAGNOSIS — Z362 Encounter for other antenatal screening follow-up: Secondary | ICD-10-CM | POA: Diagnosis not present

## 2018-08-13 DIAGNOSIS — O30042 Twin pregnancy, dichorionic/diamniotic, second trimester: Secondary | ICD-10-CM | POA: Insufficient documentation

## 2018-08-14 ENCOUNTER — Other Ambulatory Visit (HOSPITAL_COMMUNITY): Payer: Self-pay | Admitting: *Deleted

## 2018-08-14 DIAGNOSIS — O30042 Twin pregnancy, dichorionic/diamniotic, second trimester: Secondary | ICD-10-CM

## 2018-08-20 ENCOUNTER — Ambulatory Visit (INDEPENDENT_AMBULATORY_CARE_PROVIDER_SITE_OTHER): Payer: BLUE CROSS/BLUE SHIELD | Admitting: Obstetrics & Gynecology

## 2018-08-20 ENCOUNTER — Other Ambulatory Visit: Payer: Self-pay

## 2018-08-20 DIAGNOSIS — O30042 Twin pregnancy, dichorionic/diamniotic, second trimester: Secondary | ICD-10-CM

## 2018-08-20 DIAGNOSIS — Z3480 Encounter for supervision of other normal pregnancy, unspecified trimester: Secondary | ICD-10-CM

## 2018-08-20 DIAGNOSIS — Z3A24 24 weeks gestation of pregnancy: Secondary | ICD-10-CM

## 2018-08-20 NOTE — Progress Notes (Signed)
   TELEHEALTH VIRTUAL OBSTETRICS VISIT ENCOUNTER NOTE  I connected with Gabriella Ingram on 08/20/18 at  8:30 AM EDT by telephone at home and verified that I am speaking with the correct person using two identifiers.   I discussed the limitations, risks, security and privacy concerns of performing an evaluation and management service by telephone and the availability of in person appointments. I also discussed with the patient that there may be a patient responsible charge related to this service. The patient expressed understanding and agreed to proceed.  Subjective:  Gabriella Ingram is a 18 y.o. G1P0 at [redacted]w[redacted]d being followed for ongoing prenatal care.  She is currently monitored for the following issues for this high-risk pregnancy and has Dichorionic diamniotic twin gestation; Supervision of other normal pregnancy, antepartum; and Chlamydia infection affecting pregnancy on their problem list.  Patient reports backache. Reports fetal movement. Denies any contractions, bleeding or leaking of fluid.   The following portions of the patient's history were reviewed and updated as appropriate: allergies, current medications, past family history, past medical history, past social history, past surgical history and problem list.   Objective:   General:  Alert, oriented and cooperative.   Mental Status: Normal mood and affect perceived. Normal judgment and thought content.  Rest of physical exam deferred due to type of encounter  Assessment and Plan:  Pregnancy: G1P0 at [redacted]w[redacted]d 1. Supervision of other normal pregnancy, antepartum Instructions for back pain given   2. Dichorionic diamniotic twin pregnancy in second trimester [place] result reviewed with her  Preterm labor symptoms and general obstetric precautions including but not limited to vaginal bleeding, contractions, leaking of fluid and fetal movement were reviewed in detail with the patient.  I discussed the assessment and treatment plan with the  patient. The patient was provided an opportunity to ask questions and all were answered. The patient agreed with the plan and demonstrated an understanding of the instructions. The patient was advised to call back or seek an in-person office evaluation/go to MAU at [facility] for any urgent or concerning symptoms. Please refer to After Visit Summary for other counseling recommendations.   I provided 10 minutes of non-face-to-face time during this encounter.  Return in about 3 weeks (around 09/10/2018) for in person for 2 hr.  Future Appointments  Date Time Provider Department Center  09/09/2018  8:45 AM [facility] None  09/09/2018  9:00 AM Adam Phenix, MD [facility] None  09/10/2018  3:45 PM [facility] NURSE [facility]  09/10/2018  3:45 PM [facility] [place] 2 [facility]    Scheryl Darter, MD Center for [facility], [facility]

## 2018-08-20 NOTE — Progress Notes (Signed)
S/w pt via tele visit. Pt reports good fetal movement, complains of occasional back pain, not relived by maternity belt. Pt declined babyrx, states that she will purchase BP cuff and take BP during tele visits.

## 2018-08-20 NOTE — Patient Instructions (Signed)
Back Pain in Pregnancy  Back pain during pregnancy is common. Back pain may be caused by several factors that are related to changes during your pregnancy.  Follow these instructions at home:  Managing pain, stiffness, and swelling          If directed, for sudden (acute) back pain, put ice on the painful area.  ? Put ice in a plastic bag.  ? Place a towel between your skin and the bag.  ? Leave the ice on for 20 minutes, 2-3 times per day.   If directed, apply heat to the affected area before you exercise. Use the heat source that your health care provider recommends, such as a moist heat pack or a heating pad.  ? Place a towel between your skin and the heat source.  ? Leave the heat on for 20-30 minutes.  ? Remove the heat if your skin turns bright red. This is especially important if you are unable to feel pain, heat, or cold. You may have a greater risk of getting burned.   If directed, massage the affected area.  Activity   Exercise as told by your health care provider. Gentle exercise is the best way to prevent or manage back pain.   Listen to your body when lifting. If lifting hurts, ask for help or bend your knees. This uses your leg muscles instead of your back muscles.   Squat down when picking up something from the floor. Do not bend over.   Only use bed rest for short periods as told by your health care provider. Bed rest should only be used for the most severe episodes of back pain.  Standing, sitting, and lying down   Do not stand in one place for long periods of time.   Use good posture when sitting. Make sure your head rests over your shoulders and is not hanging forward. Use a pillow on your lower back if necessary.   Try sleeping on your side, preferably the left side, with a pregnancy support pillow or 1-2 regular pillows between your legs.  ? If you have back pain after a night's rest, your bed may be too soft.  ? A firm mattress may provide more support for your back during  pregnancy.  General instructions   Do not wear high heels.   Eat a healthy diet. Try to gain weight within your health care provider's recommendations.   Use a maternity girdle, elastic sling, or back brace as told by your health care provider.   Take over-the-counter and prescription medicines only as told by your health care provider.   Work with a physical therapist or massage therapist to find ways to manage back pain. Acupuncture or massage therapy may be helpful.   Keep all follow-up visits as told by your health care provider. This is important.  Contact a health care provider if:   Your back pain interferes with your daily activities.   You have increasing pain in other parts of your body.  Get help right away if:   You develop numbness, tingling, weakness, or problems with the use of your arms or legs.   You develop severe back pain that is not controlled with medicine.   You have a change in bowel or bladder control.   You develop shortness of breath, dizziness, or you faint.   You develop nausea, vomiting, or sweating.   You have back pain that is a rhythmic, cramping pain similar to labor pains. Labor   pain is usually 1-2 minutes apart, lasts for about 1 minute, and involves a bearing down feeling or pressure in your pelvis.   You have back pain and your water breaks or you have vaginal bleeding.   You have back pain or numbness that travels down your leg.   Your back pain developed after you fell.   You develop pain on one side of your back.   You see blood in your urine.   You develop skin blisters in the area of your back pain.  Summary   Back pain may be caused by several factors that are related to changes during your pregnancy.   Follow instructions as told by your health care provider for managing pain, stiffness, and swelling.   Exercise as told by your health care provider. Gentle exercise is the best way to prevent or manage back pain.   Take over-the-counter and  prescription medicines only as told by your health care provider.   Keep all follow-up visits as told by your health care provider. This is important.  This information is not intended to replace advice given to you by your health care provider. Make sure you discuss any questions you have with your health care provider.  Document Released: 08/07/2005 Document Revised: 10/15/2017 Document Reviewed: 10/15/2017  Elsevier Interactive Patient Education  2019 [employer]

## 2018-09-01 ENCOUNTER — Other Ambulatory Visit: Payer: Self-pay | Admitting: Obstetrics and Gynecology

## 2018-09-01 DIAGNOSIS — O26892 Other specified pregnancy related conditions, second trimester: Principal | ICD-10-CM

## 2018-09-01 DIAGNOSIS — N898 Other specified noninflammatory disorders of vagina: Secondary | ICD-10-CM

## 2018-09-01 MED ORDER — METRONIDAZOLE 500 MG PO TABS: 500.0000 mg | ORAL_TABLET | Freq: Two times a day (BID) | ORAL | 0 refills | Status: DC

## 2018-09-02 ENCOUNTER — Other Ambulatory Visit: Payer: Self-pay

## 2018-09-02 ENCOUNTER — Telehealth: Payer: Self-pay

## 2018-09-02 DIAGNOSIS — N898 Other specified noninflammatory disorders of vagina: Secondary | ICD-10-CM

## 2018-09-02 DIAGNOSIS — O26892 Other specified pregnancy related conditions, second trimester: Principal | ICD-10-CM

## 2018-09-02 MED ORDER — CLINDAMYCIN HCL 300 MG PO CAPS: 300.0000 mg | ORAL_CAPSULE | Freq: Three times a day (TID) | ORAL | 0 refills | Status: DC

## 2018-09-02 NOTE — Progress Notes (Signed)
Patient sent MyChart message complaining of ras and itching all over her body after taking the flagyl.  Consulted with Dr. Clearance Coots and Rx was changed to Clindamycin sent to Pharmacy.  Patient advised to take Benadryl for relief.

## 2018-09-02 NOTE — Telephone Encounter (Signed)
  Rx changed to Clindamycin; and patient was advised to take Benadryl for the rash/itching.

## 2018-09-03 ENCOUNTER — Other Ambulatory Visit: Payer: Self-pay

## 2018-09-03 ENCOUNTER — Other Ambulatory Visit (HOSPITAL_COMMUNITY)
Admission: RE | Admit: 2018-09-03 | Discharge: 2018-09-03 | Disposition: A | Discharge status: Home / Self Care | Payer: BLUE CROSS/BLUE SHIELD | Source: Ambulatory Visit | Attending: Obstetrics and Gynecology | Admitting: Obstetrics and Gynecology

## 2018-09-03 ENCOUNTER — Ambulatory Visit (INDEPENDENT_AMBULATORY_CARE_PROVIDER_SITE_OTHER): Payer: BLUE CROSS/BLUE SHIELD | Admitting: Obstetrics and Gynecology

## 2018-09-03 VITALS — BP 113/71 | HR 92 | Temp 97.6°F | Wt 153.0 lb

## 2018-09-03 DIAGNOSIS — Z3480 Encounter for supervision of other normal pregnancy, unspecified trimester: Secondary | ICD-10-CM | POA: Diagnosis not present

## 2018-09-03 DIAGNOSIS — R21 Rash and other nonspecific skin eruption: Secondary | ICD-10-CM

## 2018-09-03 DIAGNOSIS — Z3A26 26 weeks gestation of pregnancy: Secondary | ICD-10-CM

## 2018-09-03 DIAGNOSIS — O98812 Other maternal infectious and parasitic diseases complicating pregnancy, second trimester: Secondary | ICD-10-CM | POA: Insufficient documentation

## 2018-09-03 DIAGNOSIS — A749 Chlamydial infection, unspecified: Secondary | ICD-10-CM | POA: Insufficient documentation

## 2018-09-03 DIAGNOSIS — O30042 Twin pregnancy, dichorionic/diamniotic, second trimester: Secondary | ICD-10-CM

## 2018-09-03 MED ORDER — HYDROXYZINE HCL 25 MG PO TABS: 25.0000 mg | ORAL_TABLET | Freq: Four times a day (QID) | ORAL | 2 refills | Status: DC | PRN

## 2018-09-03 NOTE — Addendum Note (Signed)
Addended by: Dalphine Handing on: 09/03/2018 05:00 PM   Modules accepted: Orders

## 2018-09-03 NOTE — Progress Notes (Signed)
Patient reports fetal movement, denies pain. Pt reports after taking flagyl she began itching all over her body, rx was changed to clindamycin and itching started again. Pt reports rash on stomach, chest and arms. Sent babyrx

## 2018-09-03 NOTE — Progress Notes (Signed)
Subjective:  Gabriella Ingram is a 18 y.o. G1P0 at 68w4dbeing seen today for ongoing prenatal care.  She is currently monitored for the following issues for this high-risk pregnancy and has Dichorionic diamniotic twin gestation; Supervision of other normal pregnancy, antepartum; Chlamydia infection affecting pregnancy; and Rash on their problem list.  Patient reports developing rash after taking Flagyl. Switched to Cleocin but rash and itching continued. No new exposures. .  Contractions: Not present. Vag. Bleeding: None.  Movement: Present. Denies leaking of fluid.   The following portions of the patient's history were reviewed and updated as appropriate: allergies, current medications, past family history, past medical history, past social history, past surgical history and problem list. Problem list updated.  Objective:   Vitals:   09/03/18 1540  BP: 113/71  Pulse: 92  Temp: 97.6 F (36.4 C)  Weight: 153 lb (69.4 kg)    Fetal Status: Fetal Heart Rate (bpm): A:155/B:140   Movement: Present     General:  Alert, oriented and cooperative. Patient is in no acute distress.  Skin: Skin is warm and dry. No rash noted.   Cardiovascular: Normal heart rate noted  Respiratory: Normal respiratory effort, no problems with respiration noted  Abdomen: Soft, gravid, appropriate for gestational age. Pain/Pressure: Absent     Pelvic:  Cervical exam performed        Extremities: Normal range of motion.  Edema: None  Mental Status: Normal mood and affect. Normal behavior. Normal judgment and thought content.  Skin, fine maculopapular rash noted Urinalysis:      Assessment and Plan:  Pregnancy: G1P0 at 278w4d1. Supervision of other normal pregnancy, antepartum Stable Glucola and growth scan next week - Babyscripts Schedule Optimization  2. Dichorionic diamniotic twin pregnancy in second trimester As above  3. Chlamydia infection affecting pregnancy in second trimester TOC today  4.  Rash Uncertain etiology Will check vaginal cultures and labs - CBC - Comp Met (CMET) - hydrOXYzine (ATARAX/VISTARIL) 25 MG tablet; Take 1 tablet (25 mg total) by mouth every 6 (six) hours as needed for itching.  Dispense: 30 tablet; Refill: 2  Preterm labor symptoms and general obstetric precautions including but not limited to vaginal bleeding, contractions, leaking of fluid and fetal movement were reviewed in detail with the patient. Please refer to After Visit Summary for other counseling recommendations.  No follow-ups on file.   ErChancy MilroyMD

## 2018-09-04 LAB — COMPREHENSIVE METABOLIC PANEL
ALT: 10 IU/L (ref 0–32)
AST: 16 IU/L (ref 0–40)
Albumin/Globulin Ratio: 1 — ABNORMAL LOW (ref 1.2–2.2)
Albumin: 3.3 g/dL — ABNORMAL LOW (ref 3.9–5.0)
Alkaline Phosphatase: 76 IU/L (ref 39–117)
BUN/Creatinine Ratio: 11 (ref 9–23)
BUN: 6 mg/dL (ref 6–20)
Bilirubin Total: 0.3 mg/dL (ref 0.0–1.2)
CO2: 19 mmol/L — ABNORMAL LOW (ref 20–29)
Calcium: 9 mg/dL (ref 8.7–10.2)
Chloride: 105 mmol/L (ref 96–106)
Creatinine, Ser: 0.55 mg/dL — ABNORMAL LOW (ref 0.57–1.00)
GFR calc Af Amer: 156 mL/min/{1.73_m2} (ref >59)
GFR calc non Af Amer: 135 mL/min/{1.73_m2} (ref >59)
Globulin, Total: 3.2 g/dL (ref 1.5–4.5)
Glucose: 70 mg/dL (ref 65–99)
Potassium: 4.1 mmol/L (ref 3.5–5.2)
Sodium: 140 mmol/L (ref 134–144)
Total Protein: 6.5 g/dL (ref 6.0–8.5)

## 2018-09-04 LAB — CBC
Hematocrit: 38.3 % (ref 34.0–46.6)
Hemoglobin: 13.1 g/dL (ref 11.1–15.9)
MCH: 30.3 pg (ref 26.6–33.0)
MCHC: 34.2 g/dL (ref 31.5–35.7)
MCV: 89 fL (ref 79–97)
Platelets: 109 10*3/uL — ABNORMAL LOW (ref 150–450)
RBC: 4.32 x10E6/uL (ref 3.77–5.28)
RDW: 12.9 % (ref 11.7–15.4)
WBC: 5.4 10*3/uL (ref 3.4–10.8)

## 2018-09-08 ENCOUNTER — Inpatient Hospital Stay (HOSPITAL_COMMUNITY)
Admission: AD | Admit: 2018-09-08 | Discharge: 2018-09-08 | Disposition: A | Discharge status: Home / Self Care | Payer: BLUE CROSS/BLUE SHIELD | Attending: Obstetrics and Gynecology | Admitting: Obstetrics and Gynecology

## 2018-09-08 ENCOUNTER — Encounter (HOSPITAL_COMMUNITY): Payer: Self-pay | Admitting: *Deleted

## 2018-09-08 ENCOUNTER — Other Ambulatory Visit: Payer: Self-pay

## 2018-09-08 DIAGNOSIS — O26892 Other specified pregnancy related conditions, second trimester: Secondary | ICD-10-CM | POA: Insufficient documentation

## 2018-09-08 DIAGNOSIS — Z3A27 27 weeks gestation of pregnancy: Secondary | ICD-10-CM | POA: Insufficient documentation

## 2018-09-08 DIAGNOSIS — R109 Unspecified abdominal pain: Secondary | ICD-10-CM

## 2018-09-08 DIAGNOSIS — N898 Other specified noninflammatory disorders of vagina: Secondary | ICD-10-CM | POA: Diagnosis not present

## 2018-09-08 DIAGNOSIS — Z7982 Long term (current) use of aspirin: Secondary | ICD-10-CM | POA: Insufficient documentation

## 2018-09-08 DIAGNOSIS — O26899 Other specified pregnancy related conditions, unspecified trimester: Secondary | ICD-10-CM

## 2018-09-08 LAB — WET PREP, GENITAL
Clue Cells Wet Prep HPF POC: NONE SEEN
Sperm: NONE SEEN
Trich, Wet Prep: NONE SEEN
Yeast Wet Prep HPF POC: NONE SEEN

## 2018-09-08 LAB — URINALYSIS, ROUTINE W REFLEX MICROSCOPIC
Bilirubin Urine: NEGATIVE
Glucose, UA: NEGATIVE mg/dL
Hgb urine dipstick: NEGATIVE
Ketones, ur: NEGATIVE mg/dL
Nitrite: NEGATIVE
Protein, ur: NEGATIVE mg/dL
Specific Gravity, Urine: 1.013 (ref 1.005–1.030)
pH: 9 — ABNORMAL HIGH (ref 5.0–8.0)

## 2018-09-08 LAB — CERVICOVAGINAL ANCILLARY ONLY
Bacterial vaginitis: NEGATIVE
Candida vaginitis: NEGATIVE
Chlamydia: NEGATIVE
Neisseria Gonorrhea: NEGATIVE
Trichomonas: NEGATIVE

## 2018-09-08 MED ORDER — NIFEDIPINE 10 MG PO CAPS
10.0000 mg | ORAL_CAPSULE | ORAL | Status: DC | PRN
  Administered 2018-09-08 (×2): 10 mg via ORAL
  Filled 2018-09-08 (×2): qty 1

## 2018-09-08 NOTE — [facility] Provider Note (Signed)
History     CSN: [id number]  Arrival date and time: 09/08/18 1255   First Provider Initiated Contact with Patient 09/08/18 1351      Chief Complaint  Patient presents with  . Abdominal Pain   Gabriella Ingram is a 18 y.o. G1P0 at [redacted]w[redacted]d who presents for Abdominal Cramping.  She reports she started experiencing "cramping like a period" around 5 am this morning.  She reports that it is "hard to tell" if the pain is constant or intermittent.  She denies vaginal bleeding, but endorses vaginal discharge. She further reports sexual activity in the last 3 days.  She reports that the pain is 3-4/10 and states that it is manageable.  Patient endorses fetal movement x 2.      OB History    Gravida  1   Para      Term      Preterm      AB      Living  0     SAB      TAB      Ectopic      Multiple      Live Births              Past Medical History:  Diagnosis Date  . Chlamydia    this pregnancy  . 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 NO PAST SURGERIES      Family History  Problem Relation Age of Onset  . Anxiety disorder Mother   . Depression Mother   . Cancer Mother   . Miscarriages / [place] Mother   . Arthritis Maternal Grandmother   . Hypertension Maternal Grandmother   . Arthritis Maternal Grandfather   . Diabetes Maternal Grandfather   . Cancer Maternal Grandfather   . Hypertension Maternal Grandfather     Social History   Tobacco Use  . Smoking status: Never Smoker  . Smokeless tobacco: Never Used  Substance Use Topics  . Alcohol use: No  . Drug use: No    Allergies:  Allergies  Allergen Reactions  . Other     Food allergy -tomatoes- rash, hives, SOB Dove soap- rash    Medications Prior to Admission  Medication Sig Dispense Refill Last Dose  . aspirin EC 81 MG tablet Take 1 tablet (81 mg total) by mouth daily. Take after 12 weeks for prevention of preeclampsia later in  pregnancy 300 tablet 2 Taking  . hydrOXYzine (ATARAX/VISTARIL) 25 MG tablet Take 1 tablet (25 mg total) by mouth every 6 (six) hours as needed for itching. 30 tablet 2   . Prenatal Vit-Fe Fumarate-FA (MULTIVITAMIN-PRENATAL) 27-0.8 MG TABS tablet Take 1 tablet by mouth daily at 12 noon.   Taking    Review of Systems  Constitutional: Negative for chills and fever.  Respiratory: Negative for cough and shortness of breath.   Gastrointestinal: Positive for abdominal pain. Negative for constipation, diarrhea, nausea and vomiting.  Genitourinary: Positive for dysuria (Burning since yesterday.) and vaginal discharge. Negative for vaginal bleeding.  Musculoskeletal: Positive for back pain.  Neurological: Negative for dizziness, light-headedness and headaches.   Physical Exam   Blood pressure 114/71, pulse 87, temperature 97.9 F (36.6 C), temperature source Oral, resp. rate 16, weight 70.8 kg, last menstrual period 03/01/2018, SpO2 99 %, unknown if currently breastfeeding.  Physical Exam  Constitutional: She is oriented to person, place, and time. She appears well-developed and well-nourished. No distress.  HENT:  Head: Normocephalic  and atraumatic.  Eyes: Conjunctivae are normal.  Neck: Normal range of motion.  Cardiovascular: Normal rate.  Respiratory: Effort normal.  GI: Soft.  Genitourinary: Cervix exhibits no motion tenderness, no discharge and no friability.    Vaginal discharge and bleeding present.  There is bleeding in the vagina.    Genitourinary Comments: Speculum Exam: -Milky white mucoid discharge noted at introitus -Vaginal Vault: Pink mucosa.  Small amt milky white discharge -wet prep collected -Cervix: Pink, no lesions, cysts, or polyps.  Appears closed. No active bleeding from os-GC/CT collected -Bimanual Exam: Closed/Soft    Musculoskeletal: Normal range of motion.  Neurological: She is alert and oriented to person, place, and time.  Skin: Skin is warm and dry.   Psychiatric: She has a normal mood and affect. Her behavior is normal.    Fetal Assessment 145 bpm, Mod Var, -Decels, -Accels 150 bpm, Mod Var, -Decels, -Accels Toco: Irregular Contractions  MAU Course   Results for orders placed or performed during the hospital encounter of 09/08/18 (from the past 24 hour(s))  Urinalysis, Routine w reflex microscopic     Status: Abnormal   Collection Time: 09/08/18  1:22 PM  Result Value Ref Range   Color, Urine YELLOW YELLOW   APPearance HAZY (A) CLEAR   Specific Gravity, Urine 1.013 1.005 - 1.030   pH 9.0 (H) 5.0 - 8.0   Glucose, UA NEGATIVE NEGATIVE mg/dL   Hgb urine dipstick NEGATIVE NEGATIVE   Bilirubin Urine NEGATIVE NEGATIVE   Ketones, ur NEGATIVE NEGATIVE mg/dL   Protein, ur NEGATIVE NEGATIVE mg/dL   Nitrite NEGATIVE NEGATIVE   Leukocytes,Ua SMALL (A) NEGATIVE   RBC / HPF 0-5 0 - 5 RBC/hpf   WBC, UA 0-5 0 - 5 WBC/hpf   Bacteria, UA RARE (A) NONE SEEN   Squamous Epithelial / LPF 0-5 0 - 5  Wet prep, genital     Status: Abnormal   Collection Time: 09/08/18  1:54 PM  Result Value Ref Range   Yeast Wet Prep HPF POC NONE SEEN NONE SEEN   Trich, Wet Prep NONE SEEN NONE SEEN   Clue Cells Wet Prep HPF POC NONE SEEN NONE SEEN   WBC, Wet Prep HPF POC MANY (A) NONE SEEN   Sperm NONE SEEN    No results found.  MDM PE Labs: Wet Prep, GC/CT, UA  EFM Assessment and Plan  18 year old G1P0 at 27.2weeks Cat I FT x 2 Abdominal cramping Vaginal Discharge  -Exam findings discussed. -Offered and declined pain medication. -Labs as above. -Discussed sending urine for culture, but only providing treatment if abnormal. -No questions or concerns.   Follow Up (2:59 PM)  -Wet prep returns with insignificant findings. -Results discussed with patient. -Informed that GC/CT will return within 2-3 days. -Patient reports that she is unable to feel contractions, but continues to feel cramping. -Discussed dosing of procardia and further  monitoring. -Patient agreeable, but states she would like to go home sooner than later.  -Will give oral hydration. -Monitor and reassess.  Follow Up (3:31 PM)  -Patient reports improvement with cramping and denies perception of contractions. -Reports that she has an appt tomorrow with Dr. Alysia PennaErvin. -Encouraged to call or return to [facility]U if symptoms worsen or with the onset of new symptoms. -Discharged to home in stable condition  Cherre RobinsJessica L Maryela Tapper MSN, CNM 09/08/2018, 1:51 PM

## 2018-09-08 NOTE — [facility]U Note (Signed)
Patient reports intermittent abdominal cramping that started around 0400 this AM.  She rates her pain a 4/10.  Took 1000mg  of Tylenol around 0800 with no relief.  Denies vaginal bleeding, but having some mucous/white discharge.  Had Chlamydia during this pregnancy that was treated, but would like STD testing again.  C/o some itching and burning with urination.

## 2018-09-08 NOTE — Discharge Instructions (Signed)
Abdominal Pain During Pregnancy  Abdominal pain is common during pregnancy, and has many possible causes. Some causes are more serious than others, and sometimes the cause is not known. Abdominal pain can be a sign that labor is starting. It can also be caused by normal growth and stretching of muscles and ligaments during pregnancy. Always tell your health care provider if you have any abdominal pain. Follow these instructions at home:  Do not have sex or put anything in your vagina until your pain goes away completely.  Get plenty of rest until your pain improves.  Drink enough fluid to keep your urine pale yellow.  Take over-the-counter and prescription medicines only as told by your health care provider.  Keep all follow-up visits as told by your health care provider. This is important. Contact a health care provider if:  Your pain continues or gets worse after resting.  You have lower abdominal pain that: ? Comes and goes at regular intervals. ? Spreads to your back. ? Is similar to menstrual cramps.  You have pain or burning when you urinate. Get help right away if:  You have a fever or chills.  You have vaginal bleeding.  You are leaking fluid from your vagina.  You are passing tissue from your vagina.  You have vomiting or diarrhea that lasts for more than 24 hours.  Your baby is moving less than usual.  You feel very weak or faint.  You have shortness of breath.  You develop severe pain in your upper abdomen. Summary  Abdominal pain is common during pregnancy, and has many possible causes.  If you experience abdominal pain during pregnancy, tell your health care provider right away.  Follow your health care provider's home care instructions and keep all follow-up visits as directed. This information is not intended to replace advice given to you by your health care provider. Make sure you discuss any questions you have with your health care  provider. Document Released: 04/29/2005 Document Revised: 08/01/2016 Document Reviewed: 08/01/2016 Elsevier Interactive Patient Education  2019 [employer]  Glucose Tolerance Test During Pregnancy Why am I having this test? The glucose tolerance test (GTT) is done to check how your body processes sugar (glucose). This is one of several tests used to diagnose diabetes that develops during pregnancy (gestational diabetes mellitus). Gestational diabetes is a temporary form of diabetes that some women develop during pregnancy. It usually occurs during the second trimester of pregnancy and goes away after delivery. Testing (screening) for gestational diabetes usually occurs between 24 and 28 weeks of pregnancy. You may have the GTT test after having a 1-hour glucose screening test if the results from that test indicate that you may have gestational diabetes. You may also have this test if:  You have a history of gestational diabetes.  You have a history of giving birth to very large babies or have experienced repeated fetal loss (stillbirth).  You have signs and symptoms of diabetes, such as: ? Changes in your vision. ? Tingling or numbness in your hands or feet. ? Changes in hunger, thirst, and urination that are not otherwise explained by your pregnancy. What is being tested? This test measures the amount of glucose in your blood at different times during a period of 3 hours. This indicates how well your body is able to process glucose. What kind of sample is taken?  Blood samples are required for this test. They are usually collected by inserting a needle into a blood vessel. How  do I prepare for this test?  For 3 days before your test, eat normally. Have plenty of carbohydrate-rich foods.  Follow instructions from your health care provider about: ? Eating or drinking restrictions on the day of the test. You may be asked to not eat or drink anything other than water (fast) starting 8-10  hours before the test. ? Changing or stopping your regular medicines. Some medicines may interfere with this test. Tell a health care provider about:  All medicines you are taking, including vitamins, herbs, eye drops, creams, and over-the-counter medicines.  Any blood disorders you have.  Any surgeries you have had.  Any medical conditions you have. What happens during the test? First, your blood glucose will be measured. This is referred to as your fasting blood glucose, since you fasted before the test. Then, you will drink a glucose solution that contains a certain amount of glucose. Your blood glucose will be measured again 1, 2, and 3 hours after drinking the solution. This test takes about 3 hours to complete. You will need to stay at the testing location during this time. During the testing period:  Do not eat or drink anything other than the glucose solution.  Do not exercise.  Do not use any products that contain nicotine or tobacco, such as cigarettes and e-cigarettes. If you need help stopping, ask your health care provider. The testing procedure may vary among health care providers and hospitals. How are the results reported? Your results will be reported as milligrams of glucose per deciliter of blood (mg/dL) or millimoles per liter (mmol/L). Your health care provider will compare your results to normal ranges that were established after testing a large group of people (reference ranges). Reference ranges may vary among labs and hospitals. For this test, common reference ranges are:  Fasting: less than 95-105 mg/dL (1.9-6.2 mmol/L).  1 hour after drinking glucose: less than 180-190 mg/dL (22.9-79.8 mmol/L).  2 hours after drinking glucose: less than 155-165 mg/dL (9.2-1.1 mmol/L).  3 hours after drinking glucose: 140-145 mg/dL (9.4-1.7 mmol/L). What do the results mean? Results within reference ranges are considered normal, meaning that your glucose levels are  well-controlled. If two or more of your blood glucose levels are high, you may be diagnosed with gestational diabetes. If only one level is high, your health care provider may suggest repeat testing or other tests to confirm a diagnosis. Talk with your health care provider about what your results mean. Questions to ask your health care provider Ask your health care provider, or the department that is doing the test:  When will my results be ready?  How will I get my results?  What are my treatment options?  What other tests do I need?  What are my next steps? Summary  The glucose tolerance test (GTT) is one of several tests used to diagnose diabetes that develops during pregnancy (gestational diabetes mellitus). Gestational diabetes is a temporary form of diabetes that some women develop during pregnancy.  You may have the GTT test after having a 1-hour glucose screening test if the results from that test indicate that you may have gestational diabetes. You may also have this test if you have any symptoms or risk factors for gestational diabetes.  Talk with your health care provider about what your results mean. This information is not intended to replace advice given to you by your health care provider. Make sure you discuss any questions you have with your health care provider. Document Released: 10/29/2011  Document Revised: 12/09/2016 Document Reviewed: 12/09/2016 Elsevier Interactive Patient Education  [employer].

## 2018-09-09 ENCOUNTER — Ambulatory Visit (INDEPENDENT_AMBULATORY_CARE_PROVIDER_SITE_OTHER): Payer: BLUE CROSS/BLUE SHIELD | Admitting: Obstetrics & Gynecology

## 2018-09-09 ENCOUNTER — Other Ambulatory Visit: Payer: BLUE CROSS/BLUE SHIELD

## 2018-09-09 ENCOUNTER — Other Ambulatory Visit: Payer: Self-pay

## 2018-09-09 VITALS — BP 118/73 | HR 73 | Wt 156.0 lb

## 2018-09-09 DIAGNOSIS — Z3480 Encounter for supervision of other normal pregnancy, unspecified trimester: Secondary | ICD-10-CM

## 2018-09-09 DIAGNOSIS — O30042 Twin pregnancy, dichorionic/diamniotic, second trimester: Secondary | ICD-10-CM

## 2018-09-09 DIAGNOSIS — Z3A27 27 weeks gestation of pregnancy: Secondary | ICD-10-CM

## 2018-09-09 LAB — CULTURE, OB URINE

## 2018-09-09 LAB — GC/CHLAMYDIA PROBE AMP (~~LOC~~) NOT AT ARMC
Chlamydia: NEGATIVE
Neisseria Gonorrhea: NEGATIVE

## 2018-09-09 NOTE — Progress Notes (Signed)
   PRENATAL VISIT NOTE  Subjective:  Gabriella Ingram is a 18 y.o. G1P0 at [redacted]w[redacted]d being seen today for ongoing prenatal care.  She is currently monitored for the following issues for this high-risk pregnancy and has Dichorionic diamniotic twin gestation; Supervision of other normal pregnancy, antepartum; Chlamydia infection affecting pregnancy; and Rash on their problem list.  Patient reports occasional contractions.  Contractions: Irritability. Vag. Bleeding: None.  Movement: Present. Denies leaking of fluid.   The following portions of the patient's history were reviewed and updated as appropriate: allergies, current medications, past family history, past medical history, past social history, past surgical history and problem list.   Objective:   Vitals:   09/09/18 0850  BP: 118/73  Pulse: 73  Weight: 156 lb (70.8 kg)    Fetal Status: Fetal Heart Rate (bpm): A:147/B:138   Movement: Present     General:  Alert, oriented and cooperative. Patient is in no acute distress.  Skin: Skin is warm and dry. No rash noted.   Cardiovascular: Normal heart rate noted  Respiratory: Normal respiratory effort, no problems with respiration noted  Abdomen: Soft, gravid, appropriate for gestational age.  Pain/Pressure: Absent     Pelvic: Cervical exam deferred        Extremities: Normal range of motion.  Edema: None  Mental Status: Normal mood and affect. Normal behavior. Normal judgment and thought content.   Assessment and Plan:  Pregnancy: G1P0 at [redacted]w[redacted]d 1. Supervision of other normal pregnancy, antepartum twins  2. Dichorionic diamniotic twin pregnancy in second trimester Routine 28 week - CBC - Glucose Tolerance, 2 Hours w/1 Hour - RPR - HIV Antibody (routine testing w rflx) - Enroll Patient in Babyscripts  Preterm labor symptoms and general obstetric precautions including but not limited to vaginal bleeding, contractions, leaking of fluid and fetal movement were reviewed in detail with the  patient. Please refer to After Visit Summary for other counseling recommendations.   Return in about 4 weeks (around 10/07/2018).  Future Appointments  Date Time Provider Department Center  09/10/2018  3:45 PM [facility] NURSE [facility]  09/10/2018  3:45 PM [facility] [place] [facility]    Scheryl Darter, MD

## 2018-09-09 NOTE — Progress Notes (Signed)
Pt wants to discuss recent contractions. Pt seen yesterday at [facility].  Pt did not receive baby scripts link last week resent today.

## 2018-09-09 NOTE — Patient Instructions (Signed)

## 2018-09-10 ENCOUNTER — Other Ambulatory Visit: Payer: Self-pay

## 2018-09-10 ENCOUNTER — Ambulatory Visit (HOSPITAL_COMMUNITY): Payer: BLUE CROSS/BLUE SHIELD | Admitting: *Deleted

## 2018-09-10 ENCOUNTER — Encounter (HOSPITAL_COMMUNITY): Payer: Self-pay | Admitting: *Deleted

## 2018-09-10 ENCOUNTER — Ambulatory Visit (HOSPITAL_COMMUNITY)
Admission: RE | Admit: 2018-09-10 | Discharge: 2018-09-10 | Disposition: A | Discharge status: Home / Self Care | Payer: BLUE CROSS/BLUE SHIELD | Source: Ambulatory Visit | Attending: Obstetrics and Gynecology | Admitting: Obstetrics and Gynecology

## 2018-09-10 VITALS — BP 112/75 | HR 114 | Temp 98.6°F

## 2018-09-10 DIAGNOSIS — O30042 Twin pregnancy, dichorionic/diamniotic, second trimester: Secondary | ICD-10-CM

## 2018-09-10 DIAGNOSIS — O30049 Twin pregnancy, dichorionic/diamniotic, unspecified trimester: Secondary | ICD-10-CM | POA: Diagnosis present

## 2018-09-10 DIAGNOSIS — Z362 Encounter for other antenatal screening follow-up: Secondary | ICD-10-CM | POA: Diagnosis not present

## 2018-09-10 DIAGNOSIS — Z3A27 27 weeks gestation of pregnancy: Secondary | ICD-10-CM | POA: Diagnosis not present

## 2018-09-10 LAB — CBC
Hematocrit: 37 % (ref 34.0–46.6)
Hemoglobin: 12.6 g/dL (ref 11.1–15.9)
MCH: 30.1 pg (ref 26.6–33.0)
MCHC: 34.1 g/dL (ref 31.5–35.7)
MCV: 88 fL (ref 79–97)
Platelets: 109 10*3/uL — ABNORMAL LOW (ref 150–450)
RBC: 4.19 x10E6/uL (ref 3.77–5.28)
RDW: 13 % (ref 11.7–15.4)
WBC: 6.4 10*3/uL (ref 3.4–10.8)

## 2018-09-10 LAB — GLUCOSE TOLERANCE, 2 HOURS W/ 1HR
Glucose, 1 hour: 172 mg/dL (ref 65–179)
Glucose, 2 hour: 97 mg/dL (ref 65–152)
Glucose, Fasting: 78 mg/dL (ref 65–91)

## 2018-09-10 LAB — RPR: RPR Ser Ql: NONREACTIVE

## 2018-09-10 LAB — HIV ANTIBODY (ROUTINE TESTING W REFLEX): HIV Screen 4th Generation wRfx: NONREACTIVE

## 2018-09-11 ENCOUNTER — Other Ambulatory Visit (HOSPITAL_COMMUNITY): Payer: Self-pay | Admitting: *Deleted

## 2018-09-11 DIAGNOSIS — O30043 Twin pregnancy, dichorionic/diamniotic, third trimester: Secondary | ICD-10-CM

## 2018-09-14 ENCOUNTER — Other Ambulatory Visit: Payer: Self-pay | Admitting: *Deleted

## 2018-09-14 DIAGNOSIS — Z3480 Encounter for supervision of other normal pregnancy, unspecified trimester: Secondary | ICD-10-CM

## 2018-09-14 MED ORDER — PRENATE PIXIE 10-0.6-0.4-200 MG PO CAPS: 1.0000 | ORAL_CAPSULE | Freq: Every day | ORAL | 11 refills | Status: DC

## 2018-09-14 NOTE — Progress Notes (Signed)
PNV sent in per pt request.

## 2018-09-23 LAB — INFORMED CONSENT NEEDED

## 2018-10-02 ENCOUNTER — Other Ambulatory Visit: Payer: Self-pay | Admitting: *Deleted

## 2018-10-02 DIAGNOSIS — Z3480 Encounter for supervision of other normal pregnancy, unspecified trimester: Secondary | ICD-10-CM

## 2018-10-02 MED ORDER — BLOOD PRESSURE MONITOR KIT: 1.0000 | PACK | Freq: Once | 0 refills | Status: AC

## 2018-10-02 NOTE — Progress Notes (Signed)
Spoke with pt regarding [employer] problems. Number was given to pt to call [employer] for support.  BP cuff order was entered today and faxed to have order filled. Pt made aware may take a few days before she receives cuff by mail.

## 2018-10-07 ENCOUNTER — Other Ambulatory Visit: Payer: Self-pay

## 2018-10-07 ENCOUNTER — Encounter: Payer: Self-pay | Admitting: Obstetrics and Gynecology

## 2018-10-07 ENCOUNTER — Encounter (HOSPITAL_COMMUNITY): Payer: Self-pay

## 2018-10-07 ENCOUNTER — Ambulatory Visit (HOSPITAL_COMMUNITY): Payer: BLUE CROSS/BLUE SHIELD | Admitting: *Deleted

## 2018-10-07 ENCOUNTER — Ambulatory Visit (INDEPENDENT_AMBULATORY_CARE_PROVIDER_SITE_OTHER): Payer: BLUE CROSS/BLUE SHIELD | Admitting: Obstetrics and Gynecology

## 2018-10-07 ENCOUNTER — Ambulatory Visit (HOSPITAL_COMMUNITY)
Admission: RE | Admit: 2018-10-07 | Discharge: 2018-10-07 | Disposition: A | Discharge status: Home / Self Care | Payer: BLUE CROSS/BLUE SHIELD | Source: Ambulatory Visit | Attending: Obstetrics and Gynecology | Admitting: Obstetrics and Gynecology

## 2018-10-07 VITALS — BP 120/67 | HR 79 | Temp 98.5°F

## 2018-10-07 VITALS — Wt 158.0 lb

## 2018-10-07 DIAGNOSIS — O30043 Twin pregnancy, dichorionic/diamniotic, third trimester: Secondary | ICD-10-CM

## 2018-10-07 DIAGNOSIS — Z362 Encounter for other antenatal screening follow-up: Secondary | ICD-10-CM

## 2018-10-07 DIAGNOSIS — Z3A31 31 weeks gestation of pregnancy: Secondary | ICD-10-CM

## 2018-10-07 DIAGNOSIS — Z3480 Encounter for supervision of other normal pregnancy, unspecified trimester: Secondary | ICD-10-CM

## 2018-10-07 DIAGNOSIS — O30049 Twin pregnancy, dichorionic/diamniotic, unspecified trimester: Secondary | ICD-10-CM | POA: Diagnosis present

## 2018-10-07 NOTE — Progress Notes (Signed)
Pt is on the phone preparing for virtual visit with provider. [redacted]w[redacted]d.  

## 2018-10-07 NOTE — Progress Notes (Signed)
TELEHEALTH OBSTETRICS PRENATAL VIRTUAL VIDEO VISIT ENCOUNTER NOTE  Provider location: Center for [facility] [place]   I connected with Gabriella Ingram on 10/07/18 at  9:15 AM EDT by WebEx Video Encounter at home and verified that I am speaking with the correct person using two identifiers.   I discussed the limitations, risks, security and privacy concerns of performing an evaluation and management service by telephone and the availability of in person appointments. I also discussed with the patient that there may be a patient responsible charge related to this service. The patient expressed understanding and agreed to proceed. Subjective:  Gabriella Ingram is a 18 y.o. G1P0 at [redacted]w[redacted]d being seen today for ongoing prenatal care.  She is currently monitored for the following issues for this high-risk pregnancy and has Dichorionic diamniotic twin gestation; Supervision of other normal pregnancy, antepartum; Chlamydia infection affecting pregnancy; and Rash on their problem list.  Patient reports no complaints.  Contractions: Not present. Vag. Bleeding: None.  Movement: Present. Denies any leaking of fluid.   The following portions of the patient's history were reviewed and updated as appropriate: allergies, current medications, past family history, past medical history, past social history, past surgical history and problem list.   Objective:   Vitals:   10/07/18 0928  Weight: 158 lb (71.7 kg)    Fetal Status:     Movement: Present     General:  Alert, oriented and cooperative. Patient is in no acute distress.  Respiratory: Normal respiratory effort, no problems with respiration noted  Mental Status: Normal mood and affect. Normal behavior. Normal judgment and thought content.  Rest of physical exam deferred due to type of encounter  Imaging: [place] Mfm Ob Follow Up  Result Date: 09/10/2018 ----------------------------------------------------------------------  OBSTETRICS REPORT                        (Signed Final 09/10/2018 04:24 pm) ---------------------------------------------------------------------- Patient Info  ID #:       [id number]                          D.O.B.:  11-30-2000 (18 yrs)  Name:       Gabriella Ingram                    Visit Date: 09/10/2018 03:37 pm ---------------------------------------------------------------------- Performed By  Performed By:     Percell Boston          Ref. Address:     [address]                                                             [place], [place]  [address]  Attending:        Noralee Space MD        [facility]  Referred By:      Hermina Staggers                    MD ---------------------------------------------------------------------- Orders   #  Description                          Code         Ordered By   1  [place] MFM OB FOLLOW UP                  60454.09     Lin Landsman   2  [place] MFM OB FOLLOW UP ADDL             81191.47     Jettie Pagan  ----------------------------------------------------------------------   #  Order #                    Accession #                 Episode #   1  [id number]                  [id number]                  [id number]   2  [id number]                  [id number]                  [id number]  ---------------------------------------------------------------------- Indications   [redacted] weeks gestation of pregnancy                Z3A.27   Twin pregnancy, di/di, second trimester (low   O30.042   risk NIPS)   Encounter for other antenatal screening        Z36.2   follow-up  ---------------------------------------------------------------------- Vital Signs                                                  Height:        5'6" ---------------------------------------------------------------------- Fetal Evaluation (Fetus A)  Num Of Fetuses:  2  Fetal Heart Rate(bpm):  142  Cardiac Activity:       Observed  Fetal Lie:              Maternal right side  Presentation:           Cephalic  Placenta:               Posterior  P. Cord Insertion:      Previously Visualized  Membrane Desc:      Dividing Membrane seen - Dichorionic.  Amniotic Fluid  AFI FV:      Within normal limits                              Largest Pocket(cm)                              5.6 ---------------------------------------------------------------------- Biometry (Fetus A)  BPD:      70.1  mm     G. Age:  28w 1d         58  %    CI:         79.5   %    70 - 86                                                          FL/HC:      20.5   %    18.8 - 20.6  HC:      248.5  mm     G. Age:  27w 0d          9  %    HC/AC:      1.08        1.05 - 1.21  AC:      229.6  mm     G. Age:  27w 3d         35  %    FL/BPD:     72.6   %    71 - 87  FL:       50.9  mm     G. Age:  27w 2d         27  %    FL/AC:      22.2   %    20 - 24  HUM:      46.2  mm     G. Age:  27w 2d         39  %  Est. FW:    1059  gm      2 lb 5 oz     47  %     FW Discordancy      0 \ 4 % ---------------------------------------------------------------------- OB History  Gravidity:    1 ---------------------------------------------------------------------- Gestational Age (Fetus A)  LMP:           27w 4d        Date:  03/01/18                 EDD:   12/06/18  U/S Today:     27w 3d  EDD:   12/07/18  Best:          27w 4d     Det. By:  LMP  (03/01/18)          EDD:   12/06/18 ---------------------------------------------------------------------- Anatomy (Fetus A)  Cranium:               Appears normal         LVOT:                   Previously seen  Cavum:                 Previously seen        Aortic Arch:            Previously  seen  Ventricles:            Previously seen        Ductal Arch:            Previously seen  Choroid Plexus:        Previously seen        Diaphragm:              Previously seen  Cerebellum:            Previously seen        Stomach:                Appears normal, left                                                                        sided  Posterior Fossa:       Previously seen        Abdomen:                Previously seen  Nuchal Fold:           Previously seen        Abdominal Wall:         Previously seen  Face:                  Orbits and profile     Cord Vessels:           Previously seen                         previously seen  Lips:                  Previously seen        Kidneys:                Appear normal  Palate:                Previously seen        Bladder:                Appears normal  Thoracic:              Previously seen        Spine:                  Previously seen  Heart:  Previously seen        Upper Extremities:      Previously seen  RVOT:                  Previously seen        Lower Extremities:      Previously seen ---------------------------------------------------------------------- Fetal Evaluation (Fetus B)  Num Of Fetuses:         2  Fetal Heart Rate(bpm):  144  Cardiac Activity:       Observed  Fetal Lie:              Upper Fetus  Presentation:           Transverse, head to maternal right  Placenta:               Anterior  P. Cord Insertion:      Previously Visualized  Membrane Desc:      Dividing Membrane seen - Dichorionic.  Amniotic Fluid  AFI FV:      Within normal limits                              Largest Pocket(cm)                              6.9 ---------------------------------------------------------------------- Biometry (Fetus B)  BPD:      69.7  mm     G. Age:  28w 0d         53  %    CI:        78.86   %    70 - 86                                                          FL/HC:      19.4   %    18.8 - 20.6  HC:      248.2  mm     G. Age:  26w 6d           9  %    HC/AC:      1.07        1.05 - 1.21  AC:      231.7  mm     G. Age:  27w 4d         41  %    FL/BPD:     69.0   %    71 - 87  FL:       48.1  mm     G. Age:  26w 1d          7  %    FL/AC:      20.8   %    20 - 24  HUM:      46.4  mm     G. Age:  27w 2d         42  %  Est. FW:    1014  gm      2 lb 4 oz     40  %     FW Discordancy         4  % ---------------------------------------------------------------------- Gestational Age (Fetus B)  LMP:  27w 4d        Date:  03/01/18                 EDD:   12/06/18  U/S Today:     27w 1d                                        EDD:   12/09/18  Best:          27w 4d     Det. By:  LMP  (03/01/18)          EDD:   12/06/18 ---------------------------------------------------------------------- Anatomy (Fetus B)  Cranium:               Appears normal         LVOT:                   Previously seen  Cavum:                 Previously seen        Aortic Arch:            Previously seen  Ventricles:            Previously seen        Ductal Arch:            Previously seen  Choroid Plexus:        Previously seen        Diaphragm:              Previously seen  Cerebellum:            Previously seen        Stomach:                Appears normal, left                                                                        sided  Posterior Fossa:       Previously seen        Abdomen:                Previously seen  Nuchal Fold:           Previously seen        Abdominal Wall:         Previously seen  Face:                  Orbits and profile     Cord Vessels:           Previously seen                         previously seen  Lips:                  Previously seen        Kidneys:                Appear normal  Palate:                Previously seen  Bladder:                Appears normal  Thoracic:              Previously seen        Spine:                  Previously seen  Heart:                 Appears normal         Upper Extremities:      Previously seen                          (4CH, axis, and                         situs)  RVOT:                  Previously seen        Lower Extremities:      Previously seen ---------------------------------------------------------------------- Cervix Uterus Adnexa  Cervix  Not visualized (advanced GA >24wks)  Uterus  Normal shape and size.  Left Ovary  No adnexal mass visualized.  Right Ovary  No adnexal mass visualized.  Cul De Sac  No free fluid seen.  Adnexa  No abnormality visualized. ---------------------------------------------------------------------- Impression  Dichorionic-diamniotic twin pregnancy.  Twin A: Maternal right, cephalic, posterior placenta. Fetal  growth is appropriate for gestational age. Amniotic fluid is  normal and good fetal activity is seen.  Twin B: Upper fetus, transverse lie and head to maternal  right, anterior placenta. Fetal growth is appropriate for  gestational age. Amniotic fluid is normal and good fetal  activity is seen.  Growth discordancy: 4% (normal). ---------------------------------------------------------------------- Recommendations  An appointment was made for her to return in 4 weeks for  fetal growth assessment. ----------------------------------------------------------------------                  Noralee Space, MD Electronically Signed Final Report   09/10/2018 04:24 pm ----------------------------------------------------------------------  [place] Mfm Ob Follow Up Addl Gest  Result Date: 09/10/2018 ----------------------------------------------------------------------  OBSTETRICS REPORT                       (Signed Final 09/10/2018 04:24 pm) ---------------------------------------------------------------------- Patient Info  ID #:       [id number]                          D.O.B.:  December 17, 2000 (18 yrs)  Name:       Gabriella Ingram                    Visit Date: 09/10/2018 03:37 pm ---------------------------------------------------------------------- Performed By  Performed By:     Percell Boston          Ref. Address:     [address]  [place], [place]                                                             [address]  Attending:        Noralee Space MD        [facility]  Referred By:      Hermina Staggers                    MD ---------------------------------------------------------------------- Orders   #  Description                          Code         Ordered By   1  [place] MFM OB FOLLOW UP                  47829.56     Lin Landsman   2  [place] MFM OB FOLLOW UP ADDL             21308.65     Jettie Pagan  ----------------------------------------------------------------------   #  Order #                    Accession #                 Episode #   1  [id number]                  [id number]                  [id number]   2  [id number]                  [id number]                  [id number]  ---------------------------------------------------------------------- Indications   [redacted] weeks gestation of pregnancy                Z3A.27   Twin pregnancy, di/di, second trimester (low   O30.042   risk NIPS)   Encounter for other antenatal screening        Z36.2   follow-up  ---------------------------------------------------------------------- Vital Signs  Height:        5'6" ---------------------------------------------------------------------- Fetal Evaluation (Fetus A)  Num Of Fetuses:         2  Fetal Heart Rate(bpm):  142  Cardiac Activity:       Observed  Fetal Lie:              Maternal right side  Presentation:           Cephalic  Placenta:               Posterior  P. Cord Insertion:      Previously Visualized   Membrane Desc:      Dividing Membrane seen - Dichorionic.  Amniotic Fluid  AFI FV:      Within normal limits                              Largest Pocket(cm)                              5.6 ---------------------------------------------------------------------- Biometry (Fetus A)  BPD:      70.1  mm     G. Age:  28w 1d         58  %    CI:         79.5   %    70 - 86                                                          FL/HC:      20.5   %    18.8 - 20.6  HC:      248.5  mm     G. Age:  27w 0d          9  %    HC/AC:      1.08        1.05 - 1.21  AC:      229.6  mm     G. Age:  27w 3d         35  %    FL/BPD:     72.6   %    71 - 87  FL:       50.9  mm     G. Age:  27w 2d         27  %    FL/AC:      22.2   %    20 - 24  HUM:      46.2  mm     G. Age:  27w 2d         39  %  Est. FW:    1059  gm      2 lb 5 oz     47  %     FW Discordancy      0 \ 4 % ---------------------------------------------------------------------- OB History  Gravidity:    1 ---------------------------------------------------------------------- Gestational Age (Fetus A)  LMP:           27w 4d        Date:  03/01/18                 EDD:   12/06/18  U/S  Today:     27w 3d                                        EDD:   12/07/18  Best:          27w 4d     Det. By:  LMP  (03/01/18)          EDD:   12/06/18 ---------------------------------------------------------------------- Anatomy (Fetus A)  Cranium:               Appears normal         LVOT:                   Previously seen  Cavum:                 Previously seen        Aortic Arch:            Previously seen  Ventricles:            Previously seen        Ductal Arch:            Previously seen  Choroid Plexus:        Previously seen        Diaphragm:              Previously seen  Cerebellum:            Previously seen        Stomach:                Appears normal, left                                                                        sided  Posterior Fossa:       Previously seen         Abdomen:                Previously seen  Nuchal Fold:           Previously seen        Abdominal Wall:         Previously seen  Face:                  Orbits and profile     Cord Vessels:           Previously seen                         previously seen  Lips:                  Previously seen        Kidneys:                Appear normal  Palate:                Previously seen        Bladder:                Appears normal  Thoracic:  Previously seen        Spine:                  Previously seen  Heart:                 Previously seen        Upper Extremities:      Previously seen  RVOT:                  Previously seen        Lower Extremities:      Previously seen ---------------------------------------------------------------------- Fetal Evaluation (Fetus B)  Num Of Fetuses:         2  Fetal Heart Rate(bpm):  144  Cardiac Activity:       Observed  Fetal Lie:              Upper Fetus  Presentation:           Transverse, head to maternal right  Placenta:               Anterior  P. Cord Insertion:      Previously Visualized  Membrane Desc:      Dividing Membrane seen - Dichorionic.  Amniotic Fluid  AFI FV:      Within normal limits                              Largest Pocket(cm)                              6.9 ---------------------------------------------------------------------- Biometry (Fetus B)  BPD:      69.7  mm     G. Age:  28w 0d         53  %    CI:        78.86   %    70 - 86                                                          FL/HC:      19.4   %    18.8 - 20.6  HC:      248.2  mm     G. Age:  26w 6d          9  %    HC/AC:      1.07        1.05 - 1.21  AC:      231.7  mm     G. Age:  27w 4d         41  %    FL/BPD:     69.0   %    71 - 87  FL:       48.1  mm     G. Age:  26w 1d          7  %    FL/AC:      20.8   %    20 - 24  HUM:      46.4  mm     G. Age:  27w 2d         42  %  Est. FW:  1014  gm      2 lb 4 oz     40  %     FW Discordancy         4  %  ---------------------------------------------------------------------- Gestational Age (Fetus B)  LMP:           27w 4d        Date:  03/01/18                 EDD:   12/06/18  U/S Today:     27w 1d                                        EDD:   12/09/18  Best:          27w 4d     Det. By:  LMP  (03/01/18)          EDD:   12/06/18 ---------------------------------------------------------------------- Anatomy (Fetus B)  Cranium:               Appears normal         LVOT:                   Previously seen  Cavum:                 Previously seen        Aortic Arch:            Previously seen  Ventricles:            Previously seen        Ductal Arch:            Previously seen  Choroid Plexus:        Previously seen        Diaphragm:              Previously seen  Cerebellum:            Previously seen        Stomach:                Appears normal, left                                                                        sided  Posterior Fossa:       Previously seen        Abdomen:                Previously seen  Nuchal Fold:           Previously seen        Abdominal Wall:         Previously seen  Face:                  Orbits and profile     Cord Vessels:           Previously seen                         previously seen  Lips:  Previously seen        Kidneys:                Appear normal  Palate:                Previously seen        Bladder:                Appears normal  Thoracic:              Previously seen        Spine:                  Previously seen  Heart:                 Appears normal         Upper Extremities:      Previously seen                         (4CH, axis, and                         situs)  RVOT:                  Previously seen        Lower Extremities:      Previously seen ---------------------------------------------------------------------- Cervix Uterus Adnexa  Cervix  Not visualized (advanced GA >24wks)  Uterus  Normal shape and size.  Left Ovary  No adnexal mass  visualized.  Right Ovary  No adnexal mass visualized.  Cul De Sac  No free fluid seen.  Adnexa  No abnormality visualized. ---------------------------------------------------------------------- Impression  Dichorionic-diamniotic twin pregnancy.  Twin A: Maternal right, cephalic, posterior placenta. Fetal  growth is appropriate for gestational age. Amniotic fluid is  normal and good fetal activity is seen.  Twin B: Upper fetus, transverse lie and head to maternal  right, anterior placenta. Fetal growth is appropriate for  gestational age. Amniotic fluid is normal and good fetal  activity is seen.  Growth discordancy: 4% (normal). ---------------------------------------------------------------------- Recommendations  An appointment was made for her to return in 4 weeks for  fetal growth assessment. ----------------------------------------------------------------------                  Noralee Space, MD Electronically Signed Final Report   09/10/2018 04:24 pm ----------------------------------------------------------------------   Assessment and Plan:  Pregnancy: G1P0 at [redacted]w[redacted]d 1. Supervision of other normal pregnancy, antepartum Patient is doing well without complaints Reviewed normal diabetes screen Patient remains undecided on pediatrician  2. Dichorionic diamniotic twin pregnancy in third trimester Follow up growth today  Preterm labor symptoms and general obstetric precautions including but not limited to vaginal bleeding, contractions, leaking of fluid and fetal movement were reviewed in detail with the patient. I discussed the assessment and treatment plan with the patient. The patient was provided an opportunity to ask questions and all were answered. The patient agreed with the plan and demonstrated an understanding of the instructions. The patient was advised to call back or seek an in-person office evaluation/go to [facility]U at [facility] for any urgent or concerning symptoms. Please  refer to After Visit Summary for other counseling recommendations.   I provided 11 minutes of face-to-face time during this encounter.  Return in about 2 weeks (around 10/21/2018) for [facility], [facility].  Future Appointments  Date Time Provider Department Center  10/07/2018  3:30 PM [facility] NURSE  [facility]  10/07/2018  3:30 PM [facility] [place] 1 [facility]-M[facility]US MFC-US    Catalina Antigua, MD Center for [employer], [facility]

## 2018-10-08 ENCOUNTER — Ambulatory Visit (HOSPITAL_COMMUNITY): Payer: BLUE CROSS/BLUE SHIELD

## 2018-10-08 ENCOUNTER — Encounter (HOSPITAL_COMMUNITY): Payer: Self-pay | Admitting: Advanced Practice Midwife

## 2018-10-08 ENCOUNTER — Other Ambulatory Visit: Payer: Self-pay

## 2018-10-08 ENCOUNTER — Inpatient Hospital Stay (HOSPITAL_COMMUNITY)
Admission: AD | Admit: 2018-10-08 | Discharge: 2018-10-08 | Disposition: A | Discharge status: Home / Self Care | Payer: BLUE CROSS/BLUE SHIELD | Attending: Obstetrics and Gynecology | Admitting: Obstetrics and Gynecology

## 2018-10-08 ENCOUNTER — Other Ambulatory Visit (HOSPITAL_COMMUNITY): Payer: Self-pay | Admitting: *Deleted

## 2018-10-08 DIAGNOSIS — O4703 False labor before 37 completed weeks of gestation, third trimester: Secondary | ICD-10-CM | POA: Insufficient documentation

## 2018-10-08 DIAGNOSIS — O26893 Other specified pregnancy related conditions, third trimester: Secondary | ICD-10-CM | POA: Diagnosis not present

## 2018-10-08 DIAGNOSIS — Z7982 Long term (current) use of aspirin: Secondary | ICD-10-CM | POA: Diagnosis not present

## 2018-10-08 DIAGNOSIS — R102 Pelvic and perineal pain: Secondary | ICD-10-CM | POA: Diagnosis not present

## 2018-10-08 DIAGNOSIS — M549 Dorsalgia, unspecified: Secondary | ICD-10-CM | POA: Insufficient documentation

## 2018-10-08 DIAGNOSIS — R82998 Other abnormal findings in urine: Secondary | ICD-10-CM | POA: Diagnosis not present

## 2018-10-08 DIAGNOSIS — R109 Unspecified abdominal pain: Secondary | ICD-10-CM | POA: Insufficient documentation

## 2018-10-08 DIAGNOSIS — Z3A31 31 weeks gestation of pregnancy: Secondary | ICD-10-CM

## 2018-10-08 DIAGNOSIS — O30049 Twin pregnancy, dichorionic/diamniotic, unspecified trimester: Secondary | ICD-10-CM

## 2018-10-08 DIAGNOSIS — O30043 Twin pregnancy, dichorionic/diamniotic, third trimester: Secondary | ICD-10-CM | POA: Insufficient documentation

## 2018-10-08 LAB — URINALYSIS, ROUTINE W REFLEX MICROSCOPIC
Bilirubin Urine: NEGATIVE
Glucose, UA: NEGATIVE mg/dL
Hgb urine dipstick: NEGATIVE
Ketones, ur: NEGATIVE mg/dL
Nitrite: NEGATIVE
Protein, ur: NEGATIVE mg/dL
Specific Gravity, Urine: 1.014 (ref 1.005–1.030)
pH: 7 (ref 5.0–8.0)

## 2018-10-08 LAB — FETAL FIBRONECTIN: Fetal Fibronectin: NEGATIVE

## 2018-10-08 MED ORDER — NIFEDIPINE 10 MG PO CAPS
10.0000 mg | ORAL_CAPSULE | ORAL | Status: DC | PRN
  Administered 2018-10-08 (×3): 10 mg via ORAL
  Filled 2018-10-08 (×2): qty 1

## 2018-10-08 MED ORDER — NIFEDIPINE 10 MG PO CAPS: 10.0000 mg | ORAL_CAPSULE | ORAL | Status: DC | PRN

## 2018-10-08 MED ORDER — NIFEDIPINE ER OSMOTIC RELEASE 30 MG PO TB24: 30.0000 mg | ORAL_TABLET | Freq: Every day | ORAL | 0 refills | Status: DC | PRN

## 2018-10-08 NOTE — MAU Note (Signed)
Having some lower back pain and pelvic pressure for 2-3hrs. Have had Braxton hicks ctxs for wks but more consistent today. Denies LOF or bleeding

## 2018-10-08 NOTE — Progress Notes (Signed)
Marie Williams CNM in earlier to discuss d/c plan with pt. WRitten and verbal d/c instructions given and understanding voiced. 

## 2018-10-08 NOTE — Discharge Instructions (Signed)
Multiple Pregnancy °Having a multiple pregnancy means that a woman is carrying more than one baby at a time. She may be pregnant with twins, triplets, or more. The majority of multiple pregnancies are twins. Naturally conceiving triplets or more (higher-order multiples) is rare. °Multiple pregnancies are riskier than single pregnancies. A woman with a multiple pregnancy is more likely to have certain problems during her pregnancy. Therefore, she will need to have more frequent appointments for prenatal care. °How does a multiple pregnancy happen? °A multiple pregnancy happens when: °· The woman's body releases more than one egg at a time, and then each egg gets fertilized by a different sperm. °? This is the most common type of multiple pregnancy. °? Twins or other multiples produced this way are fraternal. They are no more alike than non-multiple siblings are. °· One sperm fertilizes one egg, which then divides into more than one embryo. °? Twins or other multiples produced this way are identical. Identical multiples are always the same gender, and they look very much alike. °Who is most likely to have a multiple pregnancy? °A multiple pregnancy is more likely to develop in women who: °· Have had fertility treatment, especially if the treatment included fertility drugs. °· Are older than 20 years of age. °· Have already had four or more children. °· Have a family history of multiple pregnancy. °How is a multiple pregnancy diagnosed? °A multiple pregnancy may be diagnosed based on: °· Symptoms such as: °? Rapid weight gain in the first 3 months of pregnancy (first trimester). °? More severe nausea and breast tenderness than what is typical of a single pregnancy. °? The uterus measuring larger than what is normal for the stage of the pregnancy. °· Blood tests that detect a higher-than-normal level of human chorionic gonadotropin (hCG). This is a hormone that your body produces in early pregnancy. °· Ultrasound exam.  This is used to confirm that you are carrying multiples. °What risks are associated with multiple pregnancy? °A multiple pregnancy puts you at a higher risk for certain problems during or after your pregnancy, including: °· Having your babies delivered before you have reached a full-term pregnancy (preterm birth). A full-term pregnancy lasts for at least 37 weeks. Babies born before 37 weeks may have a higher risk of a variety of health problems, such as breathing problems, feeding difficulties, cerebral palsy, and learning disabilities. °· Diabetes. °· Preeclampsia. This is a serious condition that causes high blood pressure along with other symptoms, such as swelling and headaches, during pregnancy. °· Excessive blood loss after childbirth (postpartum hemorrhage). °· Postpartum depression. °· Low birth weight of the babies. °How will having a multiple pregnancy affect my care? °Your health care provider will want to monitor you more closely during your pregnancy to make sure that your babies are growing normally and that you are healthy. °Follow these instructions at home: °Because your pregnancy is considered to be high risk, you will need to work closely with your health care team. You may also need to make some lifestyle changes. These may include the following: °Eating and drinking °· Increase your nutrition. °? Follow your health care provider’s recommendations for weight gain. You may need to gain a little extra weight when you are pregnant with multiples. °? Eat healthy snacks often throughout the day. This can add calories and reduce nausea. °· Drink enough fluid to keep your urine pale yellow. °· Take prenatal vitamins. °Activity °By 20-24 weeks, you may need to limit your activities. °·   Avoid activities and work that take a lot of effort (are strenuous). °· Ask your health care provider when you should stop having sexual intercourse. °· Rest often. °General instructions °· Do not use any products that  contain nicotine or tobacco, such as cigarettes and e-cigarettes. If you need help quitting, ask your health care provider. °· Do not drink alcohol or use illegal drugs. °· Take over-the-counter and prescription medicines only as told by your health care provider. °· Arrange for extra help around the house. °· Keep all follow-up visits and all prenatal visits as told by your health care provider. This is important. °Contact a health care provider if: °· You have dizziness. °· You have persistent nausea, vomiting, or diarrhea. °· You are having trouble gaining weight. °· You have feelings of depression or other emotions that are interfering with your normal activities. °Get help right away if: °· You have a fever. °· You have pain with urination. °· You have fluid leaking from your vagina. °· You have a bad-smelling vaginal discharge. °· You notice increased swelling in your face, hands, legs, or ankles. °· You have spotting or bleeding from your vagina. °· You have pelvic cramps, pelvic pressure, or nagging pain in your abdomen or lower back. °· You are having regular contractions. °· You develop a severe headache, with or without visual changes. °· You have shortness of breath or chest pain. °· You notice less fetal movement, or no fetal movement. °Summary °· Having a multiple pregnancy means that a woman is carrying more than one baby at a time. °· A multiple pregnancy puts you at a higher risk for certain problems during and after your pregnancy, such as: having your babies delivered before you have reached a full-term pregnancy (preterm birth), diabetes, preeclampsia, excessive blood loss after childbirth (postpartum hemorrhage), postpartum depression, or low birth weight of the babies. °· Your health care provider will want to monitor you more closely during your pregnancy to make sure that your babies are growing normally and that you are healthy. °· You may need to make some lifestyle changes during  pregnancy, including: increasing your nutrition, limiting your activities after 20-24 weeks of pregnancy, and arranging for extra help around the house. °This information is not intended to replace advice given to you by your health care provider. Make sure you discuss any questions you have with your health care provider. °Document Released: 02/06/2008 Document Revised: 01/22/2017 Document Reviewed: 12/29/2015 °Elsevier Interactive Patient Education © 2019 [employer] ° ° °Preterm Labor and Birth Information °Pregnancy normally lasts 39-41 weeks. Preterm labor is when labor starts early. It starts before you have been pregnant for 37 whole weeks. °What are the risk factors for preterm labor? °Preterm labor is more likely to occur in women who: °· Have an infection while pregnant. °· Have a cervix that is short. °· Have gone into preterm labor before. °· Have had surgery on their cervix. °· Are younger than age 17. °· Are older than age 35. °· Are African American. °· Are pregnant with two or more babies. °· Take street drugs while pregnant. °· Smoke while pregnant. °· Do not gain enough weight while pregnant. °· Got pregnant right after another pregnancy. °What are the symptoms of preterm labor? °Symptoms of preterm labor include: °· Cramps. The cramps may feel like the cramps some women get during their period. The cramps may happen with watery poop (diarrhea). °· Pain in the belly (abdomen). °· Pain in the lower back. °·   Regular contractions or tightening. It may feel like your belly is getting tighter. °· Pressure in the lower belly that seems to get stronger. °· More fluid (discharge) leaking from the vagina. The fluid may be watery or bloody. °· Water breaking. °Why is it important to notice signs of preterm labor? °Babies who are born early may not be fully developed. They have a higher chance for: °· Long-term heart problems. °· Long-term lung problems. °· Trouble controlling body systems, like  breathing. °· Bleeding in the brain. °· A condition called cerebral palsy. °· Learning difficulties. °· Death. °These risks are highest for babies who are born before 34 weeks of pregnancy. °How is preterm labor treated? °Treatment depends on: °· How long you were pregnant. °· Your condition. °· The health of your baby. °Treatment may involve: °· Having a stitch (suture) placed in your cervix. When you give birth, your cervix opens so the baby can come out. The stitch keeps the cervix from opening too soon. °· Staying at the hospital. °· Taking or getting medicines, such as: °? Hormone medicines. °? Medicines to stop contractions. °? Medicines to help the baby’s lungs develop. °? Medicines to prevent your baby from having cerebral palsy. °What should I do if I am in preterm labor? °If you think you are going into labor too soon, call your doctor right away. °How can I prevent preterm labor? °· Do not use any tobacco products. °? Examples of these are cigarettes, chewing tobacco, and e-cigarettes. °? If you need help quitting, ask your doctor. °· Do not use street drugs. °· Do not use any medicines unless you ask your doctor if they are safe for you. °· Talk with your doctor before taking any herbal supplements. °· Make sure you gain enough weight. °· Watch for infection. If you think you might have an infection, get it checked right away. °· If you have gone into preterm labor before, tell your doctor. °This information is not intended to replace advice given to you by your health care provider. Make sure you discuss any questions you have with your health care provider. °Document Released: 07/26/2008 Document Revised: 10/10/2015 Document Reviewed: 09/20/2015 °Elsevier Interactive Patient Education © 2019 [employer] ° °

## 2018-10-08 NOTE — Progress Notes (Signed)
Pt up to BR

## 2018-10-08 NOTE — Progress Notes (Signed)
FFN obtained.

## 2018-10-08 NOTE — MAU Provider Note (Signed)
Chief Complaint:  Contractions   First Provider Initiated Contact with Patient 10/08/18 2131      HPI: Gabriella Ingram is a 18 y.o. G1P0 at [redacted]w[redacted]d with a twin gestation who presents to maternity admissions reporting low back pain and pelvic pressure.  States they are more uncomfortable and persistent today. . She reports good fetal movement, denies LOF, vaginal bleeding, vaginal itching/burning, urinary symptoms, h/a, dizziness, n/v, diarrhea, constipation or fever/chills.    RN Note: Having some lower back pain and pelvic pressure for 2-3hrs. Have had Braxton hicks ctxs for wks but more consistent today. Denies LOF or bleeding   Past Medical History: Past Medical History:  Diagnosis Date  . Chlamydia    this pregnancy  . Decreased platelet count (HCC) 04/07/2017    Past obstetric history: OB History  Gravida Para Term Preterm AB Living  1         0  SAB TAB Ectopic Multiple Live Births               # Outcome Date GA Lbr Len/2nd Weight Sex Delivery Anes PTL Lv  1 Current             Past Surgical History: Past Surgical History:  Procedure Laterality Date  . NO PAST SURGERIES      Family History: Family History  Problem Relation Age of Onset  . Anxiety disorder Mother   . Depression Mother   . Cancer Mother   . Miscarriages / [place] Mother   . Arthritis Maternal Grandmother   . Hypertension Maternal Grandmother   . Arthritis Maternal Grandfather   . Diabetes Maternal Grandfather   . Cancer Maternal Grandfather   . Hypertension Maternal Grandfather     Social History: Social History   Tobacco Use  . Smoking status: Never Smoker  . Smokeless tobacco: Never Used  Substance Use Topics  . Alcohol use: No  . Drug use: No    Allergies:  Allergies  Allergen Reactions  . Other     Food allergy -tomatoes- rash, hives, SOB Dove soap- rash    Meds:  Medications Prior to Admission  Medication Sig Dispense Refill Last Dose  . aspirin EC 81 MG tablet Take 1  tablet (81 mg total) by mouth daily. Take after 12 weeks for prevention of preeclampsia later in pregnancy 300 tablet 2 Taking  . hydrOXYzine (ATARAX/VISTARIL) 25 MG tablet Take 1 tablet (25 mg total) by mouth every 6 (six) hours as needed for itching. (Patient not taking: Reported on 10/07/2018) 30 tablet 2 Not Taking  . Prenat-FeAsp-Meth-FA-DHA w/o A (PRENATE PIXIE) 10-0.6-0.4-200 MG CAPS Take 1 capsule by mouth daily. 30 capsule 11 Taking  . Prenatal Vit-Fe Fumarate-FA (MULTIVITAMIN-PRENATAL) 27-0.8 MG TABS tablet Take 1 tablet by mouth daily at 12 noon.   Not Taking    I have reviewed patient's Past Medical Hx, Surgical Hx, Family Hx, Social Hx, medications and allergies.   ROS:  Review of Systems  Constitutional: Negative for chills and fever.  Cardiovascular: Negative for leg swelling.  Gastrointestinal: Positive for abdominal pain. Negative for constipation, diarrhea and nausea.  Genitourinary: Positive for pelvic pain. Negative for vaginal bleeding and vaginal discharge.  Musculoskeletal: Positive for back pain.   Other systems negative  Physical Exam   Patient Vitals for the past 24 hrs:  BP Temp Pulse Resp SpO2 Height Weight  10/08/18 2116 111/63 97.9 F (36.6 C) 96 18 98 % 5\' 5"  (1.651 m) 73.5 kg   Constitutional: Well-developed, well-nourished female  in no acute distress.  Cardiovascular: normal rate and rhythm Respiratory: normal effort, clear to auscultation bilaterally GI: Abd soft, non-tender, gravid appropriate for gestational age.   No rebound or guarding. MS: Extremities nontender, no edema, normal ROM Neurologic: Alert and oriented x 4.  GU: Neg CVAT.  PELVIC EXAM: Dilation: Fingertip Effacement (%): 20 Cervical Position: Posterior Station: -1 Presentation: Vertex Exam by:: Wynelle BourgeoisMarie  CNM Cervix is still long, approximately 3cm, very posterior  FHT:  Baseline 140/145 , moderate variability, accelerations present, no decelerations Contractions: q 2-4  mins Irregular    Labs: Results for orders placed or performed during the hospital encounter of 10/08/18 (from the past 24 hour(s))  Urinalysis, Routine w reflex microscopic     Status: Abnormal   Collection Time: 10/08/18  9:33 PM  Result Value Ref Range   Color, Urine YELLOW YELLOW   APPearance CLOUDY (A) CLEAR   Specific Gravity, Urine 1.014 1.005 - 1.030   pH 7.0 5.0 - 8.0   Glucose, UA NEGATIVE NEGATIVE mg/dL   Hgb urine dipstick NEGATIVE NEGATIVE   Bilirubin Urine NEGATIVE NEGATIVE   Ketones, ur NEGATIVE NEGATIVE mg/dL   Protein, ur NEGATIVE NEGATIVE mg/dL   Nitrite NEGATIVE NEGATIVE   Leukocytes,Ua LARGE (A) NEGATIVE   RBC / HPF 0-5 0 - 5 RBC/hpf   WBC, UA 6-10 0 - 5 WBC/hpf   Bacteria, UA FEW (A) NONE SEEN   Squamous Epithelial / LPF 0-5 0 - 5   Mucus PRESENT   Fetal fibronectin     Status: None   Collection Time: 10/08/18  9:38 PM  Result Value Ref Range   Fetal Fibronectin NEGATIVE NEGATIVE    A/Positive/-- (02/20 1525)  Imaging:    MAU Course/MDM: I have ordered labs and reviewed results. Will send urine for culture.   NST reviewed and is reactive.   .  Treatments in MAU included Procardia series and Fetal fibronectin We gave 3 doses of the procardia series with good resolution of contractions Her FFn came back as Negative  Assessment: Twin Intrauterine pregnancy at 3146w5d Preterm uterine contractions Leukocytes in urine  Plan: Discharge home Preterm Labor precautions and fetal kick counts Urine to culture Follow up in Office for prenatal visits and recheck of status  Encouraged to return here or to other Urgent Care/ED if she develops worsening of symptoms, increase in pain, fever, or other concerning symptoms.   Pt stable at time of discharge.  Wynelle BourgeoisMarie  CNM, MSN Certified Nurse-Midwife 10/08/2018 9:39 PM

## 2018-10-09 ENCOUNTER — Other Ambulatory Visit: Payer: Self-pay | Admitting: Advanced Practice Midwife

## 2018-10-09 DIAGNOSIS — R82998 Other abnormal findings in urine: Secondary | ICD-10-CM

## 2018-10-09 DIAGNOSIS — O4703 False labor before 37 completed weeks of gestation, third trimester: Secondary | ICD-10-CM | POA: Diagnosis not present

## 2018-10-10 LAB — CULTURE, OB URINE: Culture: >=100000 — AB

## 2018-10-21 ENCOUNTER — Encounter: Payer: Self-pay | Admitting: Certified Nurse Midwife

## 2018-10-21 ENCOUNTER — Ambulatory Visit (INDEPENDENT_AMBULATORY_CARE_PROVIDER_SITE_OTHER): Payer: BC Managed Care – PPO | Admitting: Certified Nurse Midwife

## 2018-10-21 DIAGNOSIS — Z3480 Encounter for supervision of other normal pregnancy, unspecified trimester: Secondary | ICD-10-CM

## 2018-10-21 DIAGNOSIS — O30043 Twin pregnancy, dichorionic/diamniotic, third trimester: Secondary | ICD-10-CM

## 2018-10-21 DIAGNOSIS — Z3A33 33 weeks gestation of pregnancy: Secondary | ICD-10-CM

## 2018-10-21 NOTE — Progress Notes (Signed)
TELEHEALTH OBSTETRICS PRENATAL VIRTUAL VIDEO VISIT ENCOUNTER NOTE  Provider location: Center for [facility] [place]   I connected with Gabriella Ingram on 10/21/18 at  9:45 AM EDT by WebEx Video Encounter at home and verified that I am speaking with the correct person using two identifiers.   I discussed the limitations, risks, security and privacy concerns of performing an evaluation and management service by telephone and the availability of in person appointments. I also discussed with the patient that there may be a patient responsible charge related to this service. The patient expressed understanding and agreed to proceed. Subjective:  Gabriella Ingram is a 18 y.o. G1P0 at [redacted]w[redacted]d being seen today for ongoing prenatal care.  She is currently monitored for the following issues for this high-risk pregnancy and has Dichorionic diamniotic twin gestation; Supervision of other normal pregnancy, antepartum; Chlamydia infection affecting pregnancy; Rash; and Leukocytes in urine on their problem list.  Patient reports no complaints.  Contractions: Not present. Vag. Bleeding: None.  Movement: Present. Denies any leaking of fluid.   The following portions of the patient's history were reviewed and updated as appropriate: allergies, current medications, past family history, past medical history, past social history, past surgical history and problem list.   Objective:  There were no vitals filed for this visit.  Fetal Status:     Movement: Present     General:  Alert, oriented and cooperative. Patient is in no acute distress.  Respiratory: Normal respiratory effort, no problems with respiration noted  Mental Status: Normal mood and affect. Normal behavior. Normal judgment and thought content.  Rest of physical exam deferred due to type of encounter  Imaging: [place] Mfm Ob Follow Up  Result Date: 10/07/2018 ----------------------------------------------------------------------  OBSTETRICS REPORT                        (Signed Final 10/07/2018 04:14 pm) ---------------------------------------------------------------------- Patient Info  ID #:       [id number]                          D.O.B.:  09/16/2000 (18 yrs)  Name:       Gabriella Ingram                    Visit Date: 10/07/2018 03:27 pm ---------------------------------------------------------------------- Performed By  Performed By:     Earley Brooke     Ref. Address:     [address]                                                             [address]                                                             [place], [place]  [address]  Attending:        Lin Landsman      [facility]  Referred By:      Hermina Staggers                    MD ---------------------------------------------------------------------- Orders   #  Description                          Code         Ordered By   1  [place] MFM OB FOLLOW UP                  76816.01     RAVI SHANKAR   2  [place] MFM OB FOLLOW UP ADDL             78469.62     RAVI SHANKAR      GEST  ----------------------------------------------------------------------   #  Order #                    Accession #                 Episode #   1  [id number]                  [id number]                  [id number]   2  [id number]                  [id number]                  [id number]  ---------------------------------------------------------------------- Indications   Twin pregnancy, di/di, second trimester (low   O30.042   risk NIPS)   [redacted] weeks gestation of pregnancy                Z3A.31   Encounter for other antenatal screening        Z36.2   follow-up  ---------------------------------------------------------------------- Vital Signs                                                 Height:        5'6"  ---------------------------------------------------------------------- Fetal Evaluation (Fetus A)  Num Of Fetuses:         2  Fetal Heart Rate(bpm):  155  Cardiac Activity:       Observed  Fetal Lie:              Maternal right side  Presentation:           Cephalic  Placenta:               Posterior  P. Cord Insertion:      Previously Visualized  Membrane Desc:      Dividing Membrane seen - Dichorionic.  Amniotic Fluid  AFI FV:      Subjectively  low-normal                              Largest Pocket(cm)                              5.66 ---------------------------------------------------------------------- Biometry (Fetus A)  BPD:      82.6  mm     G. Age:  33w 2d         88  %    CI:        76.19   %    70 - 86                                                          FL/HC:      19.1   %    19.3 - 21.3  HC:      299.9  mm     G. Age:  33w 2d         63  %    HC/AC:      1.16        0.96 - 1.17  AC:      259.3  mm     G. Age:  30w 1d         14  %    FL/BPD:     69.5   %    71 - 87  FL:       57.4  mm     G. Age:  30w 0d          9  %    FL/AC:      22.1   %    20 - 24  HUM:      53.9  mm     G. Age:  31w 2d         50  %  LV:        5.2  mm  Est. FW:    1606  gm      3 lb 9 oz     39  %     FW Discordancy      0 \ 4 % ---------------------------------------------------------------------- OB History  Gravidity:    1 ---------------------------------------------------------------------- Gestational Age (Fetus A)  LMP:           31w 3d        Date:  03/01/18                 EDD:   12/06/18  U/S Today:     31w 5d                                        EDD:   12/04/18  Best:          31w 3d     Det. By:  LMP  (03/01/18)          EDD:   12/06/18 ---------------------------------------------------------------------- Anatomy (Fetus A)  Cranium:               Appears normal         LVOT:  Previously seen  Cavum:                 Previously seen        Aortic Arch:            Previously seen  Ventricles:             Previously seen        Ductal Arch:            Previously seen  Choroid Plexus:        Previously seen        Diaphragm:              Previously seen  Cerebellum:            Previously seen        Stomach:                Appears normal, left                                                                        sided  Posterior Fossa:       Previously seen        Abdomen:                Previously seen  Nuchal Fold:           Previously seen        Abdominal Wall:         Previously seen  Face:                  Orbits and profile     Cord Vessels:           Previously seen                         previously seen  Lips:                  Previously seen        Kidneys:                Appear normal  Palate:                Previously seen        Bladder:                Appears normal  Thoracic:              Previously seen        Spine:                  Previously seen  Heart:                 Appears normal         Upper Extremities:      Previously seen                         (4CH, axis, and                         situs)  RVOT:  Previously seen        Lower Extremities:      Previously seen ---------------------------------------------------------------------- Fetal Evaluation (Fetus B)  Num Of Fetuses:         2  Fetal Heart Rate(bpm):  137  Cardiac Activity:       Observed  Fetal Lie:              Maternal left side upper  Presentation:           Breech  Placenta:               Anterior  P. Cord Insertion:      Previously Visualized  Membrane Desc:      Dividing Membrane seen - Dichorionic.  Amniotic Fluid  AFI FV:      Within normal limits                              Largest Pocket(cm)                              7.92 ---------------------------------------------------------------------- Biometry (Fetus B)  BPD:      77.1  mm     G. Age:  31w 0d         25  %    CI:        70.77   %    70 - 86                                                          FL/HC:      19.1   %    19.3 - 21.3  HC:       292.1  mm     G. Age:  32w 1d         34  %    HC/AC:      1.11        0.96 - 1.17  AC:       263   mm     G. Age:  30w 3d         21  %    FL/BPD:     72.4   %    71 - 87  FL:       55.8  mm     G. Age:  29w 3d        < 3  %    FL/AC:      21.2   %    20 - 24  HUM:      52.1  mm     G. Age:  30w 3d         30  %  LV:        4.2  mm  Est. FW:    1548  gm      3 lb 7 oz     33  %     FW Discordancy         4  % ---------------------------------------------------------------------- Gestational Age (Fetus B)  LMP:           31w 3d        Date:  03/01/18  EDD:   12/06/18  U/S Today:     30w 5d                                        EDD:   12/11/18  Best:          31w 3d     Det. By:  LMP  (03/01/18)          EDD:   12/06/18 ---------------------------------------------------------------------- Anatomy (Fetus B)  Cranium:               Appears normal         LVOT:                   Previously seen  Cavum:                 Previously seen        Aortic Arch:            Previously seen  Ventricles:            Previously seen        Ductal Arch:            Previously seen  Choroid Plexus:        Previously seen        Diaphragm:              Previously seen  Cerebellum:            Previously seen        Stomach:                Appears normal, left                                                                        sided  Posterior Fossa:       Previously seen        Abdomen:                Previously seen  Nuchal Fold:           Previously seen        Abdominal Wall:         Previously seen  Face:                  Orbits and profile     Cord Vessels:           Previously seen                         previously seen  Lips:                  Previously seen        Kidneys:                Appear normal  Palate:                Previously seen        Bladder:                Appears normal  Thoracic:  Previously seen        Spine:                  Previously seen  Heart:                 Appears  normal         Upper Extremities:      Previously seen                         (4CH, axis, and                         situs)  RVOT:                  Previously seen        Lower Extremities:      Previously seen ---------------------------------------------------------------------- Cervix Uterus Adnexa  Cervix  Not visualized (advanced GA >24wks) ---------------------------------------------------------------------- Impression  Dichorionic diamniotic twin gestation with appropriate interval  growth and no significant discordancy. ---------------------------------------------------------------------- Recommendations  Repeat growth in 4 weeks  Consider delivery between 37-38 weeks. ----------------------------------------------------------------------               Lin Landsman, MD Electronically Signed Final Report   10/07/2018 04:14 pm ----------------------------------------------------------------------  [place] Mfm Ob Follow Up Addl Gest  Result Date: 10/07/2018 ----------------------------------------------------------------------  OBSTETRICS REPORT                       (Signed Final 10/07/2018 04:14 pm) ---------------------------------------------------------------------- Patient Info  ID #:       [id number]                          D.O.B.:  12/08/2000 (18 yrs)  Name:       Gabriella Ingram                    Visit Date: 10/07/2018 03:27 pm ---------------------------------------------------------------------- Performed By  Performed By:     Earley Brooke     Ref. Address:     [address]                                                             [address]                                                             [place], [place]                                                             [address]  Attending:        Lin Landsman      [facility]                                       [facility]  Referred By:      Hermina StaggersMICHAEL L ERVIN                     MD ---------------------------------------------------------------------- Orders   #  Description                          Code         Ordered By   1  US MFM OB FOLLOW UP                  76816.01     RAVI SHANKAR   2  US MFM OB FOLLOW UP ADDL             [id number]     RAVI SHANKAR      GEST  ----------------------------------------------------------------------   #  Order #                    Accession #                 Episode #   1  [id number]                  [id number]   2  [id number]                  [id number]  ---------------------------------------------------------------------- Indications   Twin pregnancy, di/di, second trimester (low   O30.042   risk NIPS)   [redacted] weeks gestation of pregnancy                Z3A.31   Encounter for other antenatal screening        Z36.2   follow-up  ---------------------------------------------------------------------- Vital Signs                                                 Height:        5'6" ---------------------------------------------------------------------- Fetal Evaluation (Fetus A)  Num Of Fetuses:         2  Fetal Heart Rate(bpm):  155  Cardiac Activity:       Observed  Fetal Lie:              Maternal right side  Presentation:           Cephalic  Placenta:               Posterior  P. Cord Insertion:      Previously Visualized  Membrane Desc:      Dividing Membrane seen - Dichorionic.  Amniotic Fluid  AFI FV:      Subjectively low-normal  Largest Pocket(cm)                              5.66 ---------------------------------------------------------------------- Biometry (Fetus A)  BPD:      82.6  mm     G. Age:  33w 2d         88  %    CI:        76.19   %    70 - 86                                                          FL/HC:      19.1   %    19.3 - 21.3  HC:      299.9  mm     G. Age:  33w 2d         63  %    HC/AC:      1.16        0.96 - 1.17  AC:      259.3   mm     G. Age:  30w 1d         14  %    FL/BPD:     69.5   %    71 - 87  FL:       57.4  mm     G. Age:  30w 0d          9  %    FL/AC:      22.1   %    20 - 24  HUM:      53.9  mm     G. Age:  31w 2d         50  %  LV:        5.2  mm  Est. FW:    1606  gm      3 lb 9 oz     39  %     FW Discordancy      0 \ 4 % ---------------------------------------------------------------------- OB History  Gravidity:    1 ---------------------------------------------------------------------- Gestational Age (Fetus A)  LMP:           31w 3d        Date:  03/01/18                 EDD:   12/06/18  U/S Today:     31w 5d                                        EDD:   12/04/18  Best:          31w 3d     Det. By:  LMP  (03/01/18)          EDD:   12/06/18 ---------------------------------------------------------------------- Anatomy (Fetus A)  Cranium:               Appears normal         LVOT:                   Previously seen  Cavum:  Previously seen        Aortic Arch:            Previously seen  Ventricles:            Previously seen        Ductal Arch:            Previously seen  Choroid Plexus:        Previously seen        Diaphragm:              Previously seen  Cerebellum:            Previously seen        Stomach:                Appears normal, left                                                                        sided  Posterior Fossa:       Previously seen        Abdomen:                Previously seen  Nuchal Fold:           Previously seen        Abdominal Wall:         Previously seen  Face:                  Orbits and profile     Cord Vessels:           Previously seen                         previously seen  Lips:                  Previously seen        Kidneys:                Appear normal  Palate:                Previously seen        Bladder:                Appears normal  Thoracic:              Previously seen        Spine:                  Previously seen  Heart:                 Appears normal          Upper Extremities:      Previously seen                         (4CH, axis, and                         situs)  RVOT:                  Previously seen        Lower Extremities:  Previously seen ---------------------------------------------------------------------- Fetal Evaluation (Fetus B)  Num Of Fetuses:         2  Fetal Heart Rate(bpm):  137  Cardiac Activity:       Observed  Fetal Lie:              Maternal left side upper  Presentation:           Breech  Placenta:               Anterior  P. Cord Insertion:      Previously Visualized  Membrane Desc:      Dividing Membrane seen - Dichorionic.  Amniotic Fluid  AFI FV:      Within normal limits                              Largest Pocket(cm)                              7.92 ---------------------------------------------------------------------- Biometry (Fetus B)  BPD:      77.1  mm     G. Age:  31w 0d         25  %    CI:        70.77   %    70 - 86                                                          FL/HC:      19.1   %    19.3 - 21.3  HC:      292.1  mm     G. Age:  32w 1d         34  %    HC/AC:      1.11        0.96 - 1.17  AC:       263   mm     G. Age:  30w 3d         21  %    FL/BPD:     72.4   %    71 - 87  FL:       55.8  mm     G. Age:  29w 3d        < 3  %    FL/AC:      21.2   %    20 - 24  HUM:      52.1  mm     G. Age:  30w 3d         30  %  LV:        4.2  mm  Est. FW:    1548  gm      3 lb 7 oz     33  %     FW Discordancy         4  % ---------------------------------------------------------------------- Gestational Age (Fetus B)  LMP:           31w 3d        Date:  03/01/18                 EDD:   12/06/18  U/S Today:  30w 5d                                        EDD:   12/11/18  Best:          31w 3d     Det. By:  LMP  (03/01/18)          EDD:   12/06/18 ---------------------------------------------------------------------- Anatomy (Fetus B)  Cranium:               Appears normal         LVOT:                   Previously seen   Cavum:                 Previously seen        Aortic Arch:            Previously seen  Ventricles:            Previously seen        Ductal Arch:            Previously seen  Choroid Plexus:        Previously seen        Diaphragm:              Previously seen  Cerebellum:            Previously seen        Stomach:                Appears normal, left                                                                        sided  Posterior Fossa:       Previously seen        Abdomen:                Previously seen  Nuchal Fold:           Previously seen        Abdominal Wall:         Previously seen  Face:                  Orbits and profile     Cord Vessels:           Previously seen                         previously seen  Lips:                  Previously seen        Kidneys:                Appear normal  Palate:                Previously seen        Bladder:                Appears normal  Thoracic:  Previously seen        Spine:                  Previously seen  Heart:                 Appears normal         Upper Extremities:      Previously seen                         (4CH, axis, and                         situs)  RVOT:                  Previously seen        Lower Extremities:      Previously seen ---------------------------------------------------------------------- Cervix Uterus Adnexa  Cervix  Not visualized (advanced GA >24wks) ---------------------------------------------------------------------- Impression  Dichorionic diamniotic twin gestation with appropriate interval  growth and no significant discordancy. ---------------------------------------------------------------------- Recommendations  Repeat growth in 4 weeks  Consider delivery between 37-38 weeks. ----------------------------------------------------------------------               Lin Landsman, MD Electronically Signed Final Report   10/07/2018 04:14 pm  ----------------------------------------------------------------------   Assessment and Plan:  Pregnancy: G1P0 at [redacted]w[redacted]d 1. Supervision of other normal pregnancy, antepartum - Patient doing well, no complaints  - Anticipatory guidance on upcoming appointments with GBS at next appointment  - Patient reports se is counting down the weeks and excited to have babies - Denies any questions, problems or concerns at this time   2. Dichorionic diamniotic twin pregnancy in third trimester - Di/di twins concordant growth  - Continue antenatal screening as scheduled with ultrasounds  - Plan for IOL at 38 weeks on 7/12, needs to be scheduled at next prenatal appointment   Preterm labor symptoms and general obstetric precautions including but not limited to vaginal bleeding, contractions, leaking of fluid and fetal movement were reviewed in detail with the patient. I discussed the assessment and treatment plan with the patient. The patient was provided an opportunity to ask questions and all were answered. The patient agreed with the plan and demonstrated an understanding of the instructions. The patient was advised to call back or seek an in-person office evaluation/go to [facility] at [facility] for any urgent or concerning symptoms. Please refer to After Visit Summary for other counseling recommendations.   I provided 10 minutes of face-to-face time during this encounter.  Return in about 19 days (around 11/09/2018) for ROB/GBS.  Future Appointments  Date Time Provider Department Center  10/28/2018  2:30 PM [facility] NURSE [facility]  10/28/2018  2:30 PM [facility] [place] 1 [facility]  11/12/2018  2:00 PM Sharyon Cable, CNM [facility] None    Sharyon Cable, CNM [facility] [employer], [facility]

## 2018-10-21 NOTE — Progress Notes (Signed)
I connected with Gabriella Ingram on 10/21/18 at  9:15 AM EDT by telephone and verified that I am speaking with the correct person using two identifiers.  Pt did not receive BP cuff. Called Christine at the pharmacy. They were trying contact pt to verify shipping address but pt did not return call. Address verified - BP cuff will be shipped today.

## 2018-10-27 ENCOUNTER — Encounter (HOSPITAL_COMMUNITY): Payer: Self-pay | Admitting: *Deleted

## 2018-10-27 ENCOUNTER — Inpatient Hospital Stay (HOSPITAL_COMMUNITY)
Admission: AD | Admit: 2018-10-27 | Discharge: 2018-10-28 | Disposition: A | Discharge status: Home / Self Care | Payer: BC Managed Care – PPO | Source: Ambulatory Visit | Attending: Family Medicine | Admitting: Family Medicine

## 2018-10-27 DIAGNOSIS — O98813 Other maternal infectious and parasitic diseases complicating pregnancy, third trimester: Secondary | ICD-10-CM | POA: Diagnosis not present

## 2018-10-27 DIAGNOSIS — Z91048 Other nonmedicinal substance allergy status: Secondary | ICD-10-CM | POA: Diagnosis not present

## 2018-10-27 DIAGNOSIS — O4703 False labor before 37 completed weeks of gestation, third trimester: Secondary | ICD-10-CM | POA: Diagnosis not present

## 2018-10-27 DIAGNOSIS — Z3A34 34 weeks gestation of pregnancy: Secondary | ICD-10-CM | POA: Diagnosis not present

## 2018-10-27 DIAGNOSIS — O4700 False labor before 37 completed weeks of gestation, unspecified trimester: Secondary | ICD-10-CM

## 2018-10-27 DIAGNOSIS — O479 False labor, unspecified: Secondary | ICD-10-CM

## 2018-10-27 DIAGNOSIS — Z9102 Food additives allergy status: Secondary | ICD-10-CM | POA: Insufficient documentation

## 2018-10-27 DIAGNOSIS — O30043 Twin pregnancy, dichorionic/diamniotic, third trimester: Secondary | ICD-10-CM

## 2018-10-27 MED ORDER — NIFEDIPINE 10 MG PO CAPS
10.0000 mg | ORAL_CAPSULE | ORAL | Status: DC | PRN
  Administered 2018-10-27 – 2018-10-28 (×4): 10 mg via ORAL
  Filled 2018-10-27 (×4): qty 1

## 2018-10-27 NOTE — MAU Note (Signed)
Pt reports SROM @2218  clear fluid. Pt reports some ctxs as well. +FM. Pt reports her last [place] baby A was vertex and baby B was breech.

## 2018-10-27 NOTE — MAU Provider Note (Addendum)
History  Chief Complaint:  Contractions and Rupture of Membranes  Gabriella Ingram is a 18 y.o. G1P0 female at 5254w2d gestation with Di/Di twins presents for possible ROM and contractions. Pt reports that around 2200, she started having some mild back pain and contractions. Contractions were mild, but have gradually gotten worse since she has been here in MAU. Reports she use to take Procardia, but has not needed it for a couple of weeks. Reports that tonight she went to use the bathroom shortly after the pain started and while using the bathroom she felt a gush of clear fluid that continued to trickle out. Pt did not wear a pad. Pt denies vaginal bleeding, vaginal itching, irritation, or odor. Pt denies any urinary s/s. Denies N/V/D or constipation. Pt reports good fetal movement.   [facility]  Next visit tomorrow, 10/28/2018. Pregnancy complicated by multiple gestation, preterm   Obstetrical History: OB History    Gravida  1   Para      Term      Preterm      AB      Living  0     SAB      TAB      Ectopic      Multiple      Live Births              Past Medical History: Past Medical History:  Diagnosis Date  . Chlamydia    this pregnancy  . Decreased platelet count (HCC) 04/07/2017    Past Surgical History: Past Surgical History:  Procedure Laterality Date  . NO PAST SURGERIES      Social History: Social History   Socioeconomic History  . Marital status: Single    Spouse name: Not on file  . Number of children: Not on file  . Years of education: Not on file  . Highest education level: Not on file  Occupational History  . Occupation: unemployed  Social Needs  . Financial resource strain: Not on file  . Food insecurity    Worry: Not on file    Inability: Not on file  . Transportation needs    Medical: Not on file    Non-medical: Not on file  Tobacco Use  . Smoking status: Never Smoker  . Smokeless tobacco: Never Used  Substance  and Sexual Activity  . Alcohol use: No  . Drug use: No  . Sexual activity: Yes    Birth control/protection: OCP, None  Lifestyle  . Physical activity    Days per week: Not on file    Minutes per session: Not on file  . Stress: Not on file  Relationships  . Social Musicianconnections    Talks on phone: Not on file    Gets together: Not on file    Attends religious service: Not on file    Active member of club or organization: Not on file    Attends meetings of clubs or organizations: Not on file    Relationship status: Not on file  Other Topics Concern  . Not on file  Social History Narrative  . Not on file    Allergies: Allergies  Allergen Reactions  . Other     Food allergy -tomatoes- rash, hives, SOB Dove soap- rash    Medications Prior to Admission  Medication Sig Dispense Refill Last Dose  . aspirin EC 81 MG tablet Take 1 tablet (81 mg total) by mouth daily. Take after 12 weeks for prevention of  preeclampsia later in pregnancy 300 tablet 2 Past Week at Unknown time  . NIFEdipine (PROCARDIA XL) 30 MG 24 hr tablet Take 1 tablet (30 mg total) by mouth daily as needed. 30 tablet 0 Past Month at Unknown time  . Prenatal Vit-Fe Fumarate-FA (MULTIVITAMIN-PRENATAL) 27-0.8 MG TABS tablet Take 1 tablet by mouth daily at 12 noon.   10/26/2018 at Unknown time  . hydrOXYzine (ATARAX/VISTARIL) 25 MG tablet Take 1 tablet (25 mg total) by mouth every 6 (six) hours as needed for itching. (Patient not taking: Reported on 10/07/2018) 30 tablet 2   . Prenat-FeAsp-Meth-FA-DHA w/o A (PRENATE PIXIE) 10-0.6-0.4-200 MG CAPS Take 1 capsule by mouth daily. 30 capsule 11     Review of Systems  Pertinent pos/neg as indicated in HPI  Physical Exam  Last menstrual period 03/01/2018, unknown if currently breastfeeding. General appearance: alert, cooperative, appears stated age and mild distress Lungs: clear to auscultation bilaterally, normal effort Heart: regular rate and rhythm Abdomen: gravid, soft,  non-tender Extremities: +1edema DTR's: +1  Spec exam: negative for pooling. White milky discharge noted. Cultures/Specimens: sample collected for fern slide Dilation: Fingertip Exam by:: Maryagnes Amos CNM STUDENT Presentation: cephalic (baby A), undeterminable (baby B)  Fetal monitoring:  FHR: Baby A 155 bpm, variability: moderate,  Accelerations: Present,  decelerations:  Absent FHR: Baby B 145 bpm, variability: moderate, Accelerations: Present, decelerations: Absent Uterine activity:  Contractions: 2-4 mins  MAU Course  Sterile Speculum exam and SVE Procardia  PO hydration  Labs:  No results found for this or any previous visit (from the past 24 hour(s)). Fern negative  Imaging:   Assessment and Plan  A:  [redacted]w[redacted]d TIUP  G1P0  Preterm Contractions, possible ROM  Cat 1 FHR P:    Reviewed   Keep next appt at [place] on   10/28/18 as scheduled   Renee Harder, SNM 6/16/202011:27 PM  Uterine contractions significantly diminished with Procardia, fluids and emptying bladder Cervix rechecked and was unchanged Dilation: Fingertip Effacement (%): 60 Station: -2 Exam by:: Hansel Feinstein CNM   Will discharge home PTL precautions Has [place] appt tomorrow.   Encouraged to return here or to other Urgent Care/ED if she develops worsening of symptoms, increase in pain, fever, or other concerning symptoms.    Seabron Spates, CNM

## 2018-10-28 ENCOUNTER — Ambulatory Visit (HOSPITAL_COMMUNITY): Payer: BC Managed Care – PPO | Admitting: *Deleted

## 2018-10-28 ENCOUNTER — Encounter (HOSPITAL_COMMUNITY): Payer: Self-pay

## 2018-10-28 ENCOUNTER — Other Ambulatory Visit: Payer: Self-pay

## 2018-10-28 ENCOUNTER — Ambulatory Visit (HOSPITAL_BASED_OUTPATIENT_CLINIC_OR_DEPARTMENT_OTHER)
Admission: RE | Admit: 2018-10-28 | Discharge: 2018-10-28 | Disposition: A | Discharge status: Home / Self Care | Payer: BC Managed Care – PPO | Source: Ambulatory Visit | Attending: Maternal & Fetal Medicine | Admitting: Maternal & Fetal Medicine

## 2018-10-28 VITALS — BP 116/72 | HR 90 | Temp 97.3°F

## 2018-10-28 DIAGNOSIS — O30043 Twin pregnancy, dichorionic/diamniotic, third trimester: Secondary | ICD-10-CM | POA: Diagnosis not present

## 2018-10-28 DIAGNOSIS — Z3A34 34 weeks gestation of pregnancy: Secondary | ICD-10-CM

## 2018-10-28 DIAGNOSIS — O30049 Twin pregnancy, dichorionic/diamniotic, unspecified trimester: Secondary | ICD-10-CM | POA: Insufficient documentation

## 2018-10-28 DIAGNOSIS — Z362 Encounter for other antenatal screening follow-up: Secondary | ICD-10-CM | POA: Diagnosis not present

## 2018-10-28 NOTE — Discharge Instructions (Signed)
Multiple Pregnancy °Having a multiple pregnancy means that a woman is carrying more than one baby at a time. She may be pregnant with twins, triplets, or more. The majority of multiple pregnancies are twins. Naturally conceiving triplets or more (higher-order multiples) is rare. °Multiple pregnancies are riskier than single pregnancies. A woman with a multiple pregnancy is more likely to have certain problems during her pregnancy. Therefore, she will need to have more frequent appointments for prenatal care. °How does a multiple pregnancy happen? °A multiple pregnancy happens when: °· The woman's body releases more than one egg at a time, and then each egg gets fertilized by a different sperm. °? This is the most common type of multiple pregnancy. °? Twins or other multiples produced this way are fraternal. They are no more alike than non-multiple siblings are. °· One sperm fertilizes one egg, which then divides into more than one embryo. °? Twins or other multiples produced this way are identical. Identical multiples are always the same gender, and they look very much alike. °Who is most likely to have a multiple pregnancy? °A multiple pregnancy is more likely to develop in women who: °· Have had fertility treatment, especially if the treatment included fertility drugs. °· Are older than 20 years of age. °· Have already had four or more children. °· Have a family history of multiple pregnancy. °How is a multiple pregnancy diagnosed? °A multiple pregnancy may be diagnosed based on: °· Symptoms such as: °? Rapid weight gain in the first 3 months of pregnancy (first trimester). °? More severe nausea and breast tenderness than what is typical of a single pregnancy. °? The uterus measuring larger than what is normal for the stage of the pregnancy. °· Blood tests that detect a higher-than-normal level of human chorionic gonadotropin (hCG). This is a hormone that your body produces in early pregnancy. °· Ultrasound exam.  This is used to confirm that you are carrying multiples. °What risks are associated with multiple pregnancy? °A multiple pregnancy puts you at a higher risk for certain problems during or after your pregnancy, including: °· Having your babies delivered before you have reached a full-term pregnancy (preterm birth). A full-term pregnancy lasts for at least 37 weeks. Babies born before 37 weeks may have a higher risk of a variety of health problems, such as breathing problems, feeding difficulties, cerebral palsy, and learning disabilities. °· Diabetes. °· Preeclampsia. This is a serious condition that causes high blood pressure along with other symptoms, such as swelling and headaches, during pregnancy. °· Excessive blood loss after childbirth (postpartum hemorrhage). °· Postpartum depression. °· Low birth weight of the babies. °How will having a multiple pregnancy affect my care? °Your health care provider will want to monitor you more closely during your pregnancy to make sure that your babies are growing normally and that you are healthy. °Follow these instructions at home: °Because your pregnancy is considered to be high risk, you will need to work closely with your health care team. You may also need to make some lifestyle changes. These may include the following: °Eating and drinking °· Increase your nutrition. °? Follow your health care provider’s recommendations for weight gain. You may need to gain a little extra weight when you are pregnant with multiples. °? Eat healthy snacks often throughout the day. This can add calories and reduce nausea. °· Drink enough fluid to keep your urine pale yellow. °· Take prenatal vitamins. °Activity °By 20-24 weeks, you may need to limit your activities. °·   Avoid activities and work that take a lot of effort (are strenuous). °· Ask your health care provider when you should stop having sexual intercourse. °· Rest often. °General instructions °· Do not use any products that  contain nicotine or tobacco, such as cigarettes and e-cigarettes. If you need help quitting, ask your health care provider. °· Do not drink alcohol or use illegal drugs. °· Take over-the-counter and prescription medicines only as told by your health care provider. °· Arrange for extra help around the house. °· Keep all follow-up visits and all prenatal visits as told by your health care provider. This is important. °Contact a health care provider if: °· You have dizziness. °· You have persistent nausea, vomiting, or diarrhea. °· You are having trouble gaining weight. °· You have feelings of depression or other emotions that are interfering with your normal activities. °Get help right away if: °· You have a fever. °· You have pain with urination. °· You have fluid leaking from your vagina. °· You have a bad-smelling vaginal discharge. °· You notice increased swelling in your face, hands, legs, or ankles. °· You have spotting or bleeding from your vagina. °· You have pelvic cramps, pelvic pressure, or nagging pain in your abdomen or lower back. °· You are having regular contractions. °· You develop a severe headache, with or without visual changes. °· You have shortness of breath or chest pain. °· You notice less fetal movement, or no fetal movement. °Summary °· Having a multiple pregnancy means that a woman is carrying more than one baby at a time. °· A multiple pregnancy puts you at a higher risk for certain problems during and after your pregnancy, such as: having your babies delivered before you have reached a full-term pregnancy (preterm birth), diabetes, preeclampsia, excessive blood loss after childbirth (postpartum hemorrhage), postpartum depression, or low birth weight of the babies. °· Your health care provider will want to monitor you more closely during your pregnancy to make sure that your babies are growing normally and that you are healthy. °· You may need to make some lifestyle changes during  pregnancy, including: increasing your nutrition, limiting your activities after 20-24 weeks of pregnancy, and arranging for extra help around the house. °This information is not intended to replace advice given to you by your health care provider. Make sure you discuss any questions you have with your health care provider. °Document Released: 02/06/2008 Document Revised: 01/22/2017 Document Reviewed: 12/29/2015 °Elsevier Interactive Patient Education © 2019 [employer] ° ° °Preterm Labor and Birth Information °Pregnancy normally lasts 39-41 weeks. Preterm labor is when labor starts early. It starts before you have been pregnant for 37 whole weeks. °What are the risk factors for preterm labor? °Preterm labor is more likely to occur in women who: °· Have an infection while pregnant. °· Have a cervix that is short. °· Have gone into preterm labor before. °· Have had surgery on their cervix. °· Are younger than age 17. °· Are older than age 35. °· Are African American. °· Are pregnant with two or more babies. °· Take street drugs while pregnant. °· Smoke while pregnant. °· Do not gain enough weight while pregnant. °· Got pregnant right after another pregnancy. °What are the symptoms of preterm labor? °Symptoms of preterm labor include: °· Cramps. The cramps may feel like the cramps some women get during their period. The cramps may happen with watery poop (diarrhea). °· Pain in the belly (abdomen). °· Pain in the lower back. °·   Regular contractions or tightening. It may feel like your belly is getting tighter. °· Pressure in the lower belly that seems to get stronger. °· More fluid (discharge) leaking from the vagina. The fluid may be watery or bloody. °· Water breaking. °Why is it important to notice signs of preterm labor? °Babies who are born early may not be fully developed. They have a higher chance for: °· Long-term heart problems. °· Long-term lung problems. °· Trouble controlling body systems, like  breathing. °· Bleeding in the brain. °· A condition called cerebral palsy. °· Learning difficulties. °· Death. °These risks are highest for babies who are born before 34 weeks of pregnancy. °How is preterm labor treated? °Treatment depends on: °· How long you were pregnant. °· Your condition. °· The health of your baby. °Treatment may involve: °· Having a stitch (suture) placed in your cervix. When you give birth, your cervix opens so the baby can come out. The stitch keeps the cervix from opening too soon. °· Staying at the hospital. °· Taking or getting medicines, such as: °? Hormone medicines. °? Medicines to stop contractions. °? Medicines to help the baby’s lungs develop. °? Medicines to prevent your baby from having cerebral palsy. °What should I do if I am in preterm labor? °If you think you are going into labor too soon, call your doctor right away. °How can I prevent preterm labor? °· Do not use any tobacco products. °? Examples of these are cigarettes, chewing tobacco, and e-cigarettes. °? If you need help quitting, ask your doctor. °· Do not use street drugs. °· Do not use any medicines unless you ask your doctor if they are safe for you. °· Talk with your doctor before taking any herbal supplements. °· Make sure you gain enough weight. °· Watch for infection. If you think you might have an infection, get it checked right away. °· If you have gone into preterm labor before, tell your doctor. °This information is not intended to replace advice given to you by your health care provider. Make sure you discuss any questions you have with your health care provider. °Document Released: 07/26/2008 Document Revised: 10/10/2015 Document Reviewed: 09/20/2015 °Elsevier Interactive Patient Education © 2019 [employer] ° °

## 2018-11-04 ENCOUNTER — Ambulatory Visit (HOSPITAL_COMMUNITY): Payer: BLUE CROSS/BLUE SHIELD

## 2018-11-04 ENCOUNTER — Encounter (HOSPITAL_COMMUNITY): Payer: Self-pay

## 2018-11-08 ENCOUNTER — Inpatient Hospital Stay (HOSPITAL_COMMUNITY): Payer: BC Managed Care – PPO | Admitting: Anesthesiology

## 2018-11-08 ENCOUNTER — Inpatient Hospital Stay (HOSPITAL_COMMUNITY)
Admission: AD | Admit: 2018-11-08 | Discharge: 2018-11-10 | DRG: 805 | Disposition: A | Discharge status: Home / Self Care | Payer: BC Managed Care – PPO | Attending: Obstetrics and Gynecology | Admitting: Obstetrics and Gynecology

## 2018-11-08 ENCOUNTER — Encounter (HOSPITAL_COMMUNITY): Payer: Self-pay

## 2018-11-08 ENCOUNTER — Other Ambulatory Visit: Payer: Self-pay

## 2018-11-08 DIAGNOSIS — O42913 Preterm premature rupture of membranes, unspecified as to length of time between rupture and onset of labor, third trimester: Secondary | ICD-10-CM

## 2018-11-08 DIAGNOSIS — O30049 Twin pregnancy, dichorionic/diamniotic, unspecified trimester: Secondary | ICD-10-CM | POA: Diagnosis present

## 2018-11-08 DIAGNOSIS — A749 Chlamydial infection, unspecified: Secondary | ICD-10-CM | POA: Diagnosis present

## 2018-11-08 DIAGNOSIS — O42013 Preterm premature rupture of membranes, onset of labor within 24 hours of rupture, third trimester: Secondary | ICD-10-CM | POA: Diagnosis present

## 2018-11-08 DIAGNOSIS — Z3A36 36 weeks gestation of pregnancy: Secondary | ICD-10-CM | POA: Diagnosis not present

## 2018-11-08 DIAGNOSIS — O42919 Preterm premature rupture of membranes, unspecified as to length of time between rupture and onset of labor, unspecified trimester: Secondary | ICD-10-CM | POA: Diagnosis present

## 2018-11-08 DIAGNOSIS — Z30430 Encounter for insertion of intrauterine contraceptive device: Secondary | ICD-10-CM

## 2018-11-08 DIAGNOSIS — O328XX2 Maternal care for other malpresentation of fetus, fetus 2: Secondary | ICD-10-CM | POA: Diagnosis present

## 2018-11-08 DIAGNOSIS — O30043 Twin pregnancy, dichorionic/diamniotic, third trimester: Secondary | ICD-10-CM | POA: Diagnosis present

## 2018-11-08 DIAGNOSIS — Z1159 Encounter for screening for other viral diseases: Secondary | ICD-10-CM | POA: Diagnosis not present

## 2018-11-08 LAB — CBC
HCT: 37.8 % (ref 36.0–46.0)
HCT: 37.7 % (ref 36.0–46.0)
Hemoglobin: 12.7 g/dL (ref 12.0–15.0)
Hemoglobin: 12.7 g/dL (ref 12.0–15.0)
MCH: 29.7 pg (ref 26.0–34.0)
MCH: 29.4 pg (ref 26.0–34.0)
MCHC: 33.6 g/dL (ref 30.0–36.0)
MCHC: 33.7 g/dL (ref 30.0–36.0)
MCV: 88.3 fL (ref 80.0–100.0)
MCV: 87.3 fL (ref 80.0–100.0)
Platelets: 94 10*3/uL — ABNORMAL LOW (ref 150–400)
Platelets: 99 10*3/uL — ABNORMAL LOW (ref 150–400)
RBC: 4.28 MIL/uL (ref 3.87–5.11)
RBC: 4.32 MIL/uL (ref 3.87–5.11)
RDW: 12.9 % (ref 11.5–15.5)
RDW: 12.8 % (ref 11.5–15.5)
WBC: 6.1 10*3/uL (ref 4.0–10.5)
WBC: 10.8 10*3/uL — ABNORMAL HIGH (ref 4.0–10.5)
nRBC: 0 % (ref 0.0–0.2)
nRBC: 0 % (ref 0.0–0.2)

## 2018-11-08 LAB — TYPE AND SCREEN
ABO/RH(D): A POS
Antibody Screen: NEGATIVE

## 2018-11-08 LAB — SARS CORONAVIRUS 2 BY RT PCR (HOSPITAL ORDER, PERFORMED IN ~~LOC~~ HOSPITAL LAB): SARS Coronavirus 2: NEGATIVE

## 2018-11-08 LAB — RPR: RPR Ser Ql: NONREACTIVE

## 2018-11-08 MED ORDER — IBUPROFEN 600 MG PO TABS: 600.0000 mg | ORAL_TABLET | Freq: Four times a day (QID) | ORAL | Status: DC

## 2018-11-08 MED ORDER — OXYTOCIN 40 UNITS IN NORMAL SALINE INFUSION - SIMPLE MED
2.5000 [IU]/h | INTRAVENOUS | Status: DC
  Administered 2018-11-08: 20:00:00 2.5 [IU]/h via INTRAVENOUS
  Filled 2018-11-08: qty 1000

## 2018-11-08 MED ORDER — BETAMETHASONE SOD PHOS & ACET 6 (3-3) MG/ML IJ SUSP
12.0000 mg | INTRAMUSCULAR | Status: DC
  Administered 2018-11-08: 12 mg via INTRAMUSCULAR
  Filled 2018-11-08 (×2): qty 2

## 2018-11-08 MED ORDER — COCONUT OIL OIL
1.0000 "application " | TOPICAL_OIL | Status: DC | PRN
  Administered 2018-11-09: 1 via TOPICAL

## 2018-11-08 MED ORDER — ONDANSETRON HCL 4 MG/2ML IJ SOLN: 4.0000 mg | INTRAMUSCULAR | Status: DC | PRN

## 2018-11-08 MED ORDER — LIDOCAINE HCL (PF) 1 % IJ SOLN
INTRAMUSCULAR | Status: DC | PRN
  Administered 2018-11-08: 11 mL via EPIDURAL

## 2018-11-08 MED ORDER — SOD CITRATE-CITRIC ACID 500-334 MG/5ML PO SOLN: 30.0000 mL | ORAL | Status: DC | PRN

## 2018-11-08 MED ORDER — DIPHENHYDRAMINE HCL 50 MG/ML IJ SOLN: 12.5000 mg | INTRAMUSCULAR | Status: DC | PRN

## 2018-11-08 MED ORDER — OXYCODONE HCL 5 MG PO TABS
5.0000 mg | ORAL_TABLET | ORAL | Status: DC | PRN
  Administered 2018-11-09 – 2018-11-10 (×4): 5 mg via ORAL
  Filled 2018-11-08 (×5): qty 1

## 2018-11-08 MED ORDER — OXYTOCIN 40 UNITS IN NORMAL SALINE INFUSION - SIMPLE MED
1.0000 m[IU]/min | INTRAVENOUS | Status: DC
  Administered 2018-11-08: 11:00:00 2 m[IU]/min via INTRAVENOUS

## 2018-11-08 MED ORDER — OXYCODONE HCL 5 MG PO TABS: 10.0000 mg | ORAL_TABLET | ORAL | Status: DC | PRN

## 2018-11-08 MED ORDER — LACTATED RINGERS IV SOLN
INTRAVENOUS | Status: DC
  Administered 2018-11-08 (×3): via INTRAVENOUS

## 2018-11-08 MED ORDER — ONDANSETRON HCL 4 MG/2ML IJ SOLN
4.0000 mg | Freq: Four times a day (QID) | INTRAMUSCULAR | Status: DC | PRN
  Administered 2018-11-08: 4 mg via INTRAVENOUS
  Filled 2018-11-08: qty 2

## 2018-11-08 MED ORDER — FLEET ENEMA 7-19 GM/118ML RE ENEM: 1.0000 | ENEMA | RECTAL | Status: DC | PRN

## 2018-11-08 MED ORDER — SIMETHICONE 80 MG PO CHEW: 80.0000 mg | CHEWABLE_TABLET | ORAL | Status: DC | PRN

## 2018-11-08 MED ORDER — ZOLPIDEM TARTRATE 5 MG PO TABS: 5.0000 mg | ORAL_TABLET | Freq: Every evening | ORAL | Status: DC | PRN

## 2018-11-08 MED ORDER — TERBUTALINE SULFATE 1 MG/ML IJ SOLN: 0.2500 mg | Freq: Once | INTRAMUSCULAR | Status: DC | PRN

## 2018-11-08 MED ORDER — FENTANYL-BUPIVACAINE-NACL 0.5-0.125-0.9 MG/250ML-% EP SOLN
12.0000 mL/h | EPIDURAL | Status: DC | PRN
  Administered 2018-11-08: 20:00:00 12 mL/h via EPIDURAL
  Filled 2018-11-08 (×2): qty 250

## 2018-11-08 MED ORDER — SENNOSIDES-DOCUSATE SODIUM 8.6-50 MG PO TABS
2.0000 | ORAL_TABLET | ORAL | Status: DC
  Administered 2018-11-08 – 2018-11-09 (×2): 2 via ORAL
  Filled 2018-11-08 (×2): qty 2

## 2018-11-08 MED ORDER — LACTATED RINGERS IV SOLN
500.0000 mL | Freq: Once | INTRAVENOUS | Status: AC
  Administered 2018-11-08: 500 mL via INTRAVENOUS

## 2018-11-08 MED ORDER — LIDOCAINE HCL (PF) 1 % IJ SOLN
30.0000 mL | INTRAMUSCULAR | Status: DC | PRN
  Filled 2018-11-08: qty 30

## 2018-11-08 MED ORDER — OXYTOCIN BOLUS FROM INFUSION
500.0000 mL | Freq: Once | INTRAVENOUS | Status: AC
  Administered 2018-11-08: 500 mL via INTRAVENOUS

## 2018-11-08 MED ORDER — BENZOCAINE-MENTHOL 20-0.5 % EX AERO
1.0000 "application " | INHALATION_SPRAY | CUTANEOUS | Status: DC | PRN
  Administered 2018-11-08: 1 via TOPICAL
  Filled 2018-11-08: qty 56

## 2018-11-08 MED ORDER — SODIUM CHLORIDE (PF) 0.9 % IJ SOLN
INTRAMUSCULAR | Status: DC | PRN
  Administered 2018-11-08: 14 mL/h via EPIDURAL

## 2018-11-08 MED ORDER — SODIUM CHLORIDE 0.9 % IV SOLN
5.0000 10*6.[IU] | Freq: Once | INTRAVENOUS | Status: AC
  Administered 2018-11-08: 5 10*6.[IU] via INTRAVENOUS
  Filled 2018-11-08: qty 5

## 2018-11-08 MED ORDER — EPHEDRINE 5 MG/ML INJ: 10.0000 mg | INTRAVENOUS | Status: DC | PRN

## 2018-11-08 MED ORDER — PRENATAL MULTIVITAMIN CH
1.0000 | ORAL_TABLET | Freq: Every day | ORAL | Status: DC
  Administered 2018-11-09 – 2018-11-10 (×2): 1 via ORAL
  Filled 2018-11-08 (×2): qty 1

## 2018-11-08 MED ORDER — DIPHENHYDRAMINE HCL 25 MG PO CAPS: 25.0000 mg | ORAL_CAPSULE | Freq: Four times a day (QID) | ORAL | Status: DC | PRN

## 2018-11-08 MED ORDER — PHENYLEPHRINE 40 MCG/ML (10ML) SYRINGE FOR IV PUSH (FOR BLOOD PRESSURE SUPPORT): 80.0000 ug | PREFILLED_SYRINGE | INTRAVENOUS | Status: DC | PRN

## 2018-11-08 MED ORDER — TETANUS-DIPHTH-ACELL PERTUSSIS 5-2.5-18.5 LF-MCG/0.5 IM SUSP: 0.5000 mL | Freq: Once | INTRAMUSCULAR | Status: DC

## 2018-11-08 MED ORDER — ONDANSETRON HCL 4 MG PO TABS: 4.0000 mg | ORAL_TABLET | ORAL | Status: DC | PRN

## 2018-11-08 MED ORDER — ACETAMINOPHEN 325 MG PO TABS
650.0000 mg | ORAL_TABLET | ORAL | Status: DC | PRN
  Administered 2018-11-09 (×2): 650 mg via ORAL
  Filled 2018-11-08 (×2): qty 2

## 2018-11-08 MED ORDER — WITCH HAZEL-GLYCERIN EX PADS: 1.0000 "application " | MEDICATED_PAD | CUTANEOUS | Status: DC | PRN

## 2018-11-08 MED ORDER — LACTATED RINGERS IV SOLN
500.0000 mL | INTRAVENOUS | Status: DC | PRN
  Administered 2018-11-08: 15:00:00 500 mL via INTRAVENOUS

## 2018-11-08 MED ORDER — LEVONORGESTREL 19.5 MCG/DAY IU IUD
INTRAUTERINE_SYSTEM | Freq: Once | INTRAUTERINE | Status: AC
  Administered 2018-11-08: 1 via INTRAUTERINE
  Filled 2018-11-08: qty 1

## 2018-11-08 MED ORDER — DIBUCAINE (PERIANAL) 1 % EX OINT: 1.0000 "application " | TOPICAL_OINTMENT | CUTANEOUS | Status: DC | PRN

## 2018-11-08 MED ORDER — FENTANYL CITRATE (PF) 100 MCG/2ML IJ SOLN
100.0000 ug | INTRAMUSCULAR | Status: DC | PRN
  Filled 2018-11-08: qty 2

## 2018-11-08 MED ORDER — PENICILLIN G 3 MILLION UNITS IVPB - SIMPLE MED
3.0000 10*6.[IU] | INTRAVENOUS | Status: DC
  Administered 2018-11-08 (×3): 3 10*6.[IU] via INTRAVENOUS
  Filled 2018-11-08 (×5): qty 100

## 2018-11-08 NOTE — MAU Note (Signed)
Pt reports to MAU c/o SROM @ 0240 clear fluid. Pt reports +FM. No bleeding. Occasional contractions.

## 2018-11-08 NOTE — Progress Notes (Signed)
Subjective: Gabriella Ingram is a 18 y.o. G1P0 at [redacted]w[redacted]d by LMP admitted for PPROM. She is pregnant with Di-Di twins. Baby A is vertex and Baby B is breech. She is contracting more now since when she arrived at [facility]. She is requesting an epidural. Anesthesia called about platelets of 94; redraw due at 0945. Anesthesia willing to place epidural now.  Objective: BP (!) 126/92   Pulse 66   Temp 97.9 F (36.6 C) (Oral)   Resp 16   Wt 78.4 kg   LMP 03/01/2018   SpO2 98%   BMI 28.77 kg/m    FHT: (A) FHR: 145 bpm, variability: moderate,  accelerations:  Present,  decelerations:  Absent  (B) FHR: 135 bpm / moderate variability / accels present / decels absent UC:   regular, every 4-6 minutes SVE:   Dilation: 3.5 Effacement (%): 80 Station: -3 Exam by:: Laury Deep, CNM   Labs: Lab Results  Component Value Date   WBC 6.1 11/08/2018   HGB 12.7 11/08/2018   HCT 37.8 11/08/2018   MCV 88.3 11/08/2018   PLT 94 (L) 11/08/2018    Assessment / Plan: Spontaneous labor, progressing normally Preterm  Labor: Progressing normally Preeclampsia:  n/a Fetal Wellbeing:  Category I Pain Control:  Labor support without medications -- planning epidural I/D:  n/a Anticipated MOD:  NSVD Discussed with patient and partner the need for pitocin to augment her labor and an epidural with a vaginal delivery of twins; especially with twin B being breech requiring a vaginal breech delivery. Explained there is a remote possibility with a breech vaginal extraction that an urgent/stat c-section could occur. Patient verbalized an understanding of the plan of care and agrees.   Laury Deep, MSN, CNM 11/08/2018, 9:16 AM

## 2018-11-08 NOTE — Progress Notes (Signed)
Subjective: Gabriella Ingram is a 18 y.o. G1P0 at [redacted]w[redacted]d by LMP admitted for PPROM. She is resting well with an epidural that was placed earlier today. Cervical just performed by Darden Dates, RN. Prepare for twin vaginal delivery.  Objective: BP 135/83   Pulse 85   Temp 98.2 F (36.8 C) (Oral)   Resp 16   Wt 78.4 kg   LMP 03/01/2018   SpO2 98%   BMI 28.77 kg/m  No intake/output data recorded. Total I/O In: 200 [IV Piggyback:200] Out: -   FHT: (A) FHR: 120 bpm, variability: moderate,  accelerations:  Present,  decelerations:  Present variable  (B) FHR: 125 bpm / moderate variability / accels present / decels absent UC:   regular, every 1-3 minutes SVE:   Dilation: 9 Effacement (%): 100 Station: 0 Exam by:: [employer], RN   Labs: Lab Results  Component Value Date   WBC 6.1 11/08/2018   HGB 12.7 11/08/2018   HCT 37.8 11/08/2018   MCV 88.3 11/08/2018   PLT 94 (L) 11/08/2018    Assessment / Plan: Augmentation of labor, progressing well PPROM Di-Di Twins (vtx  breech) Labor: Progressing on Pitocin Preeclampsia:  n/a Fetal Wellbeing:  Category I Pain Control:  Epidural I/D:  n/a Anticipated MOD:  NSVD  Laury Deep, MSN, CNM 11/08/2018, 4:00 PM

## 2018-11-08 NOTE — Discharge Summary (Addendum)
Postpartum Discharge Summary     Patient Name: Gabriella FarberSymone Umstead DOB: Jun 19, 2000 MRN: [id number]  Date of admission: 11/08/2018 Delivering Provider:    Rico Alaerry, BoyA Magan [[id number]]  DAVIS, Wayne SeverKELLY M    Negro, BoyB RaviniaSymone [[id number]]  Conan BowensAVIS, KELLY M   Date of discharge: 11/10/2018  Admitting diagnosis: 36wks, twins, water broke Intrauterine pregnancy: 147w0d     Secondary diagnosis:  Active Problems:   Dichorionic diamniotic twin gestation   Chlamydia infection affecting pregnancy   Preterm premature rupture of membranes (PPROM) with unknown onset of labor  Additional problems: None Discharge diagnosis: Preterm Pregnancy Delivered                                                                Postpartum procedures:n/a Augmentation: Pitocin Complications: None  Hospital course:  Onset of Labor With Vaginal Delivery     18 y.o. yo G1P0 at 5847w0d with di/di twins was admitted in Latent Labor on 11/08/2018. Patient had an uncomplicated labor course. Please see delivery note for details. Post placental Liletta placed at the time of delivery. Patient had an uncomplicated postpartum course.  She is ambulating, tolerating a regular diet, passing flatus, and urinating well. Patient is discharged home in stable condition on 11/10/18.  Magnesium Sulfate recieved: No BMZ received: Yes  Physical exam  Vitals:   11/09/18 2121 11/10/18 0340 11/10/18 0346 11/10/18 0551  BP: 114/70 (!) 143/92 127/80 117/74  Pulse: 69 (!) 57 (!) 57 62  Resp: 18 18  18   Temp: 98.1 F (36.7 C) 97.7 F (36.5 C)  98.2 F (36.8 C)  TempSrc: Oral Oral  Oral  SpO2:  100%  100%  Weight:       General: alert, cooperative and no distress Lochia: appropriate Uterine Fundus: firm Incision: Healing well with no significant drainage DVT Evaluation: No evidence of DVT seen on physical exam  Labs: Lab Results  Component Value Date   WBC 13.4 (H) 11/09/2018   HGB 11.8 (L) 11/09/2018   HCT 34.5 (L) 11/09/2018   MCV 87.1  11/09/2018   PLT 92 (L) 11/09/2018   CMP Latest Ref Rng & Units 09/03/2018  Glucose 65 - 99 mg/dL 70  BUN 6 - 20 mg/dL 6  Creatinine 0.860.57 - 5.781.00 mg/dL 4.69(G0.55(L)  Sodium 295134 - 284144 mmol/L 140  Potassium 3.5 - 5.2 mmol/L 4.1  Chloride 96 - 106 mmol/L 105  CO2 20 - 29 mmol/L 19(L)  Calcium 8.7 - 10.2 mg/dL 9.0  Total Protein 6.0 - 8.5 g/dL 6.5  Total Bilirubin 0.0 - 1.2 mg/dL 0.3  Alkaline Phos 39 - 117 IU/L 76  AST 0 - 40 IU/L 16  ALT 0 - 32 IU/L 10    Discharge instruction: per After Visit Summary and "Baby and Me Booklet".  After visit meds:  Allergies as of 11/10/2018      Reactions   Other    Food allergy -tomatoes- rash, hives, SOB Dove soap- rash      Medication List    TAKE these medications   acetaminophen 325 MG tablet Commonly known as: Tylenol Take 2 tablets (650 mg total) by mouth every 4 (four) hours as needed (for pain scale < 4).   aspirin EC 81 MG tablet Take 1 tablet (81 mg total) by mouth  daily. Take after 12 weeks for prevention of preeclampsia later in pregnancy   ibuprofen 600 MG tablet Commonly known as: ADVIL Take 1 tablet (600 mg total) by mouth every 6 (six) hours.   Prenate Pixie 10-0.6-0.4-200 MG Caps Take 1 capsule by mouth daily.       Diet: routine diet  Activity: Advance as tolerated. Pelvic rest for 6 weeks.   Outpatient follow up:4 weeks Follow up Appt: Future Appointments  Date Time Provider [place]  12/17/2018 10:00 AM Lajean Manes, CNM [facility] None   Follow up Visit: Please schedule this patient for Postpartum visit in: 4 weeks with the following provider: Any provider For C/S patients schedule nurse incision check in weeks 2 weeks: no High risk pregnancy complicated by: di/di twins Delivery mode:  SVD Anticipated Birth Control:  IUD done post partum PP Procedures needed: n/a  Schedule Integrated BH visit: no  Newborn Data:   Dela, Sweeny [[id number]]  Live born female  Birth Weight: 4 lb 15.2 oz  (2245 g) APGAR: 52, 9  Newborn Delivery   Birth date/time: 11/08/2018 19:26:00 Delivery type: Vaginal, Spontaneous       Jae, Bruck [[id number]]  Live born female  Birth Weight: 5 lb 1.1 oz (2299 g) APGAR: 2, 7  Newborn Delivery   Birth date/time: 11/08/2018 19:32:00 Delivery type: Vaginal, Breech     Baby Feeding: Bottle and Breast Disposition:home with mother  11/10/2018 Glenice Bow, DO  Attestation: I have seen this patient and agree with the resident's documentation. I have examined them separately, and we have discussed the plan of care.  Lambert Mody. Juleen China, DO OB/GYN Fellow

## 2018-11-08 NOTE — Anesthesia Procedure Notes (Signed)
Epidural Patient location during procedure: OB Start time: 11/08/2018 9:42 AM End time: 11/08/2018 9:55 AM  Staffing Anesthesiologist: Lynda Rainwater, MD Performed: anesthesiologist   Preanesthetic Checklist Completed: patient identified, site marked, surgical consent, pre-op evaluation, timeout performed, IV checked, risks and benefits discussed and monitors and equipment checked  Epidural Patient position: sitting Prep: ChloraPrep Patient monitoring: heart rate, cardiac monitor, continuous pulse ox and blood pressure Approach: midline Location: L2-L3 Injection technique: LOR saline  Needle:  Needle type: Tuohy  Needle gauge: 17 G Needle length: 9 cm Needle insertion depth: 6 cm Catheter type: closed end flexible Catheter size: 20 Guage Catheter at skin depth: 10 cm Test dose: negative  Assessment Events: blood not aspirated, injection not painful, no injection resistance, negative IV test and no paresthesia  Additional Notes Reason for block:procedure for pain

## 2018-11-08 NOTE — Progress Notes (Signed)
Subjective: Resting well with epidural. Last cervical exam was 1 hr 15 minutes prior. Here to re-check cervix.  Objective: BP (!) 131/91   Pulse 77   Temp 98.2 F (36.8 C) (Oral)   Resp 16   Wt 78.4 kg   LMP 03/01/2018   SpO2 98%   BMI 28.77 kg/m  No intake/output data recorded. Total I/O In: 200 [IV [id number]:629] Out: -   FHT: (A) FHR: 120 bpm, variability: moderate,  accelerations:  Present,  decelerations:  Absent (B) FHR: 125 bpm / moderate variability / accels present / decels absent  UC:   regular, every 1-3.5 minutes SVE:   Dilation: 10 Effacement (%): 100 Station: Plus 2 Exam by:: Laury Deep, CNM   Labs: Lab Results  Component Value Date   WBC 6.1 11/08/2018   HGB 12.7 11/08/2018   HCT 37.8 11/08/2018   MCV 88.3 11/08/2018   PLT 94 (L) 11/08/2018    Assessment / Plan: Spontaneous labor, progressing normally PPROM Di-Di Twins  Labor: Progressing on Pitocin Preeclampsia:  n/a Fetal Wellbeing:  Category I Pain Control:  Epidural I/D:  n/a Anticipated MOD:  NSVD  Laury Deep, MSN, CNM 11/08/2018, 5:46 PM

## 2018-11-08 NOTE — Anesthesia Preprocedure Evaluation (Signed)

## 2018-11-08 NOTE — H&P (Signed)
OBSTETRIC ADMISSION HISTORY AND PHYSICAL  Gabriella FarberSymone Ingram is a 18 y.o. female G1P0 with IUP at 1450w0d by L/19 presenting for PPROM. Patient was at home and woke up to large gush of fluid around 0240.  Reports fetal movement. Denies vaginal bleeding. States occasional contractions but nothing too uncomfortable.  She received her prenatal care at [facility].  Support person in labor: boyfriend  Ultrasounds . 19w4: 2% discordance  A- posterior placenta  B- anterior placenta  no fetal anomalies identified . 23w4: 16% discordance . 27w4: A EFW 47%  B EFW 40%, discordance 4% . 31w3: A EFW 39%  B EFW 33%, discordance 4% . 34w3: 10% discordance  A EFW 55%  B EFW 35%  Prenatal History/Complications: . Di-di twins . Chlamydia infection with negative TOC  Past Medical History: Past Medical History:  Diagnosis Date  . Chlamydia    this pregnancy  . Decreased platelet count (HCC) 04/07/2017    Past Surgical History: Past Surgical History:  Procedure Laterality Date  . NO PAST SURGERIES      Obstetrical History: OB History    Gravida  1   Para      Term      Preterm      AB      Living  0     SAB      TAB      Ectopic      Multiple      Live Births              Social History: Social History   Socioeconomic History  . Marital status: Single    Spouse name: Not on file  . Number of children: Not on file  . Years of education: Not on file  . Highest education level: Not on file  Occupational History  . Occupation: unemployed  Social Needs  . Financial resource strain: Not on file  . Food insecurity    Worry: Not on file    Inability: Not on file  . Transportation needs    Medical: Not on file    Non-medical: Not on file  Tobacco Use  . Smoking status: Never Smoker  . Smokeless tobacco: Never Used  Substance and Sexual Activity  . Alcohol use: No  . Drug use: No  . Sexual activity: Yes    Birth control/protection: None  Lifestyle  .  Physical activity    Days per week: Not on file    Minutes per session: Not on file  . Stress: Not on file  Relationships  . Social Musicianconnections    Talks on phone: Not on file    Gets together: Not on file    Attends religious service: Not on file    Active member of club or organization: Not on file    Attends meetings of clubs or organizations: Not on file    Relationship status: Not on file  Other Topics Concern  . Not on file  Social History Narrative  . Not on file    Family History: Family History  Problem Relation Age of Onset  . Anxiety disorder Mother   . Depression Mother   . Cancer Mother   . Miscarriages / [place]birth[place] Mother   . Arthritis Maternal Grandmother   . Hypertension Maternal Grandmother   . Arthritis Maternal Grandfather   . Diabetes Maternal Grandfather   . Cancer Maternal Grandfather   . Hypertension Maternal Grandfather     Allergies: Allergies  Allergen Reactions  . Other  Food allergy -tomatoes- rash, hives, SOB Dove soap- rash    Medications Prior to Admission  Medication Sig Dispense Refill Last Dose  . aspirin EC 81 MG tablet Take 1 tablet (81 mg total) by mouth daily. Take after 12 weeks for prevention of preeclampsia later in pregnancy 300 tablet 2   . Prenat-FeAsp-Meth-FA-DHA w/o A (PRENATE PIXIE) 10-0.6-0.4-200 MG CAPS Take 1 capsule by mouth daily. 30 capsule 11      Review of Systems  All systems reviewed and negative except as stated in HPI  Blood pressure 122/75, pulse 67, temperature 98.4 F (36.9 C), temperature source Oral, resp. rate 16, weight 78.4 kg, last menstrual period 03/01/2018, SpO2 98 %, unknown if currently breastfeeding. General appearance: alert, well-appearing, NAD Lungs: no respiratory distress Heart: regular rate  Abdomen: soft, non-tender; gravid  Pelvic: deferred Extremities: no significant LE edema  Presentation: A - vertex, B breech with head to maternal right on BSUS Fetal monitoring:  145/150s/mod/+a/-d Uterine activity: irregular Dilation: 1.5 Effacement (%): 60 Exam by:: Gabriella cox rn   Prenatal labs: ABO, Rh: A/Positive/-- (02/20 1525) Antibody: Negative (02/20 1525) Rubella: 8.37 (02/20 1525) RPR: Non Reactive (04/29 0911)  HBsAg: Negative (02/20 1525)  HIV: Non Reactive (04/29 0911)  GBS:   unknown, culture pending Glucola: normal 2-hr Genetic screening:  Low risk NIPS  screen negative AFP  Prenatal Transfer Tool  Maternal Diabetes: No Genetic Screening: Normal Maternal Ultrasounds/Referrals: Normal Fetal Ultrasounds or other Referrals:  None Maternal Substance Abuse:  No Significant Maternal Medications:  ASA  PNV Significant Maternal Lab Results: None  No results found for this or any previous visit (from the past 24 hour(s)).  Patient Active Problem List   Diagnosis Date Noted  . Preterm premature rupture of membranes (PPROM) with unknown onset of labor 11/08/2018  . Leukocytes in urine 10/09/2018  . Rash 09/03/2018  . Supervision of other normal pregnancy, antepartum 07/24/2018  . Chlamydia infection affecting pregnancy 07/24/2018  . Dichorionic diamniotic twin gestation 07/02/2018    Assessment/Plan:  Gabriella Ingram is a 18 y.o. G1P0 at [redacted]w[redacted]d here for PPROM.  Labor: PPROM around 44. Expectant management given starting to feel some contractions. If no change, will plan to place FB and give cytotec.  -- pain control: open to options  Fetal Wellbeing: EFW 10% discordance by most recent U/S, 35 and 55%. Twin A cephalic by BSUS, Twin B breech with head to maternal right.  -- GBS (unknown) - culture pending, will give PCN -- continuous fetal monitoring - category I   Postpartum Planning -- breast/IUD or OCPs -- RI/[declined]Tdap   Gabriella Ingram S. Gabriella China, DO OB/GYN Fellow

## 2018-11-09 ENCOUNTER — Encounter: Payer: BC Managed Care – PPO | Admitting: Advanced Practice Midwife

## 2018-11-09 LAB — ABO/RH: ABO/RH(D): A POS

## 2018-11-09 LAB — CBC
HCT: 34.5 % — ABNORMAL LOW (ref 36.0–46.0)
Hemoglobin: 11.8 g/dL — ABNORMAL LOW (ref 12.0–15.0)
MCH: 29.8 pg (ref 26.0–34.0)
MCHC: 34.2 g/dL (ref 30.0–36.0)
MCV: 87.1 fL (ref 80.0–100.0)
Platelets: 92 10*3/uL — ABNORMAL LOW (ref 150–400)
RBC: 3.96 MIL/uL (ref 3.87–5.11)
RDW: 12.8 % (ref 11.5–15.5)
WBC: 13.4 10*3/uL — ABNORMAL HIGH (ref 4.0–10.5)
nRBC: 0 % (ref 0.0–0.2)

## 2018-11-09 LAB — CULTURE, BETA STREP (GROUP B ONLY)

## 2018-11-09 NOTE — Progress Notes (Signed)
Notified Dr.Kim of platelet count of 92. Dr. Maudie Mercury ordered to hold ibuprofen for now. Tylenol and oxycodone ordered prn already. Will discuss with patient and provide prn's as needed. Maxwell Caul, Leretha Dykes [place]

## 2018-11-09 NOTE — Progress Notes (Signed)
Post Partum Day 1 Subjective: no complaints, up ad lib, voiding and tolerating PO  Objective: Blood pressure 129/82, pulse 65, temperature 98.3 F (36.8 C), temperature source Oral, resp. rate 18, weight 78.4 kg, last menstrual period 03/01/2018, SpO2 100 %, unknown if currently breastfeeding.  Physical Exam:  General: alert, cooperative, appears stated age and no distress Lochia: appropriate Uterine Fundus: firm Incision: n/a DVT Evaluation: No evidence of DVT seen on physical exam.  Recent Labs    11/08/18 2037 11/09/18 0548  HGB 12.7 11.8*  HCT 37.7 34.5*    Assessment/Plan: Plan for discharge tomorrow and Contraception PP IUD in place   LOS: 1 day   Gabriella Ingram 11/09/2018, 11:31 AM

## 2018-11-09 NOTE — Lactation Note (Signed)
This note was copied from a baby's chart. Lactation Consultation Note Babies 8 hrs old. 36 0/7 weeks. Mom is breast/formula.  Mom stated babies has an uncoordinated suck, having a hard time to take a bottle. Mom hasn't put babies to the breast yet. Encouraged to put babies to the breast for next feeding before giving formula.  Mom has great everted nipples. Hand expression taught w/colostrum easily expressed. Breast are tender. Mom shown how to use DEBP & how to disassemble, clean, & reassemble parts. Mom knows to pump q3h for 15-20 min. Mom pumped while LC in rm. Collected 2 ml colostrum. Encouraged hand expression after BF.  Mom encouraged to waken baby for feeds if hasn't cued in 3-4 hrs.  LPI information sheet given and highlighted. LPI feeding habits and behavior discussed. Mom states understanding. Encouraged STS, I&O, feed one baby at a time to learn baby and get use to BF before trying to feed both babies together.  Discussed importance of supply and demand. Encouraged mom to call for assistance or questions. Lactation brochure given.  Patient Name: Gabriella Ingram DPOEU'M Date: 11/09/2018 Reason for consult: Initial assessment;Late-preterm 34-36.6wks;Infant < 6lbs;1st time breastfeeding;Multiple gestation   Maternal Data Has patient been taught Hand Expression?: Yes Does the patient have breastfeeding experience prior to this delivery?: No  Feeding Feeding Type: Bottle Fed - Formula Nipple Type: Slow - flow  LATCH Score       Type of Nipple: Everted at rest and after stimulation  Comfort (Breast/Nipple): Soft / non-tender        Interventions Interventions: Breast feeding basics reviewed;DEBP;Support pillows;Breast massage;Expressed milk;Hand express;Breast compression  Lactation Tools Discussed/Used Tools: Pump Breast pump type: Double-Electric Breast Pump WIC Program: Yes Pump Review: Setup, frequency, and cleaning;Milk Storage Initiated by:: Allayne Stack  RN IBCLC Date initiated:: 11/09/18   Consult Status Consult Status: Follow-up Date: 11/09/18 Follow-up type: In-patient    Theodoro Kalata 11/09/2018, 3:49 AM

## 2018-11-10 LAB — GROUP B STREP BY PCR: Group B strep by PCR: NEGATIVE

## 2018-11-10 MED ORDER — ACETAMINOPHEN 325 MG PO TABS: 650.0000 mg | ORAL_TABLET | ORAL | Status: DC | PRN

## 2018-11-10 MED ORDER — IBUPROFEN 600 MG PO TABS: 600.0000 mg | ORAL_TABLET | Freq: Four times a day (QID) | ORAL | 0 refills | Status: DC

## 2018-11-10 NOTE — Progress Notes (Signed)
Patient's husband called out saying patient had fell. Patient was sitting in the chair when staff entered the room. Patient told staff that she was moving too fast to get to baby who was spitting up and tripped over her luggage bag and landed on her knees while holding baby but baby did not fall. Mom stated she felt fine just had some cramping pain. Floor RN did an assessment on mom and baby.MD notified with no new orders and will see the patient in the morning.

## 2018-11-10 NOTE — Discharge Instructions (Signed)

## 2018-11-10 NOTE — Lactation Note (Signed)
This note was copied from a baby's chart. Lactation Consultation Note  Patient Name: Gabriella Ingram [id number] Date: 11/10/2018 Reason for consult: Follow-up assessment;1st time breastfeeding;Primapara;Infant < 6lbs;Late-preterm 34-36.6wks;Multiple gestation;Infant weight loss;Mother's request  26 hours old LPI twins who are being partially BF and formula fed by their mother, she's a P1. Mom has pumped twice today and her breasst are starting to fill up she reported some mild discomfort. Gabriella Ingram set her up with ice packs and instructed to do it prior pumping to bring the insterstitial swelling down. Stressed the importance of consistent pumping throughout the day to avoid engorgement, LC also addressed sore nipples, mom has plenty of colostrum she pumped 8 ml on her last pumping session, she also has coconut oil in the room.  Baby A He's < 5 lbs and at 3% weight loss. Offered assistance with latch but only baby was ready. Mom took baby to the left breast in cross cradle position and he latched briefly to the breast but fell asleep soon after. Asked mom to call for assistance if needed. Per parents, now baby "A" is becoming the better feeding, he's taking the breast "a little bit" but he's also taking the bottle without any problems unlike baby B.  Baby B He's < 6 lbs and at 5% weight loss. Per MD note, he had "low glucose" but serum glucose was 48 and now is up to 54. LC showed dad the pace feeding technique and baby B was able to get about 10 ml of Similac 22 calorie formula. Only Enfamil extra slow nipples left in the room, LC didn't see any NFant purple nipples (both nipples have a "purple" rim). Parents relieved that baby B is not sucking at least from a bottle, advised parents to try the pace feeding technique whenever they have to supplement baby and to use mom's EBM on the next feeding prior using any formula. Reviewed breastmilk and formula storage guidelines as well as LPI policy.  Feeding  plan:  1. Encouraged mom to feed babies STS 8-12 times/24 hours or sooner if feeding cues are present 2. Mom will ice her breast for 20-30 minutes prior pumping (if needed) to relieve discomfort 3. She'll pump every 3 hours after feedings and will use her breastmilk for breast care and coconut oil prior pumping 4. Parents will use mother's EBM first to supplement babies and then formula to complete volumes required for supplementation, they're aware that will increase every 24 hours till they reach the 72 hours mark  Parents reported all questions and concerns were answered, they're both aware of [place] services and will call PRN.    Maternal Data    Feeding Feeding Type: Formula Nipple Type: Extra Slow Flow(Enfamil)   Interventions Interventions: Breast feeding basics reviewed;Breast massage;Hand express;Breast compression  Lactation Tools Discussed/Used     Consult Status Consult Status: Follow-up Date: 11/11/18 Follow-up type: In-patient    Gabriella Ingram 11/10/2018, 2:54 PM

## 2018-11-10 NOTE — Anesthesia Postprocedure Evaluation (Signed)
Anesthesia Post Note  Patient: Yeimi Debnam  Procedure(s) Performed: AN AD HOC LABOR EPIDURAL     Patient location during evaluation: Mother Baby Anesthesia Type: Epidural Level of consciousness: awake and alert Pain management: pain level controlled Vital Signs Assessment: post-procedure vital signs reviewed and stable Respiratory status: spontaneous breathing, nonlabored ventilation and respiratory function stable Cardiovascular status: stable Postop Assessment: no headache, no backache, epidural receding, no apparent nausea or vomiting, patient able to bend at knees, adequate PO intake and able to ambulate Anesthetic complications: no    Last Vitals:  Vitals:   11/10/18 0346 11/10/18 0551  BP: 127/80 117/74  Pulse: (!) 57 62  Resp:  18  Temp:  36.8 C  SpO2:  100%    Last Pain:  Vitals:   11/10/18 1053  TempSrc:   PainSc: 6    Pain Goal: Patients Stated Pain Goal: 2 (11/09/18 1507)                 France Ravens Hristova

## 2018-11-11 ENCOUNTER — Ambulatory Visit: Payer: Self-pay

## 2018-11-11 NOTE — Anesthesia Postprocedure Evaluation (Signed)
Anesthesia Post Note  Patient: Gabriella Ingram  Procedure(s) Performed: AN AD HOC LABOR EPIDURAL     Patient location during evaluation: Mother Baby Anesthesia Type: Epidural Level of consciousness: awake and alert Pain management: pain level controlled Vital Signs Assessment: post-procedure vital signs reviewed and stable Respiratory status: spontaneous breathing, nonlabored ventilation and respiratory function stable Cardiovascular status: stable Postop Assessment: no headache, no backache and epidural receding Anesthetic complications: no    Last Vitals:  Vitals:   11/10/18 0346 11/10/18 0551  BP: 127/80 117/74  Pulse: (!) 57 62  Resp:  18  Temp:  36.8 C  SpO2:  100%    Last Pain:  Vitals:   11/10/18 1053  TempSrc:   PainSc: [place]

## 2018-11-11 NOTE — Lactation Note (Signed)
This note was copied from a baby's chart. Lactation Consultation Note  Patient Name: Gabriella Ingram [id number] Date: 11/11/2018   Visited with P2 Mom of twins at 32 weeks at 83 hrs old.    Baby B <6 lbs and at 7% weight loss. Mom is predominantly bottle feeding and pumping.  Baby will latch, but unable to sustain a deep latch and falls asleep.  Mom's breasts are full.  To use ice packs 20 mins between pumping.  Has a Medela PIS at home.  Baby fed 20 ml of EBM using a slow flow nipple.    Baby A <5 lbs and at 4% weight loss. Baby is being bottle fed EBM.  To increase volume to 20-30 ml today, increasing as tolerated.  FOB was feeding baby in sidelying position.  Baby took 20 ml well.  Plan- 1- Keep baby STS as much as possible 2- Every 3 hrs, or sooner if baby's cue they are hungry, offer breast for a limit of 30 mins. 3- Supplement with EBM 20-30 ml, increasing as tolerated 4- Pump both breasts 15-20 mins.  Ice 20 mins between pumping if breasts engorged, and warm compresses and massage prior to and during pumping.  5- Follow up with OP Lactation, to inquire about [facility].  If not using [facility], Mom should be referred to OP Lactation Nonah Mattes)  Mom aware of OP lactation support available to her.  Encouraged her to call prn.  Interventions Interventions: Skin to skin;Breast massage;Hand express;Ice;DEBP;Hand pump;Expressed milk;Coconut oil  Lactation Tools Discussed/Used Tools: Bottle;Pump;Coconut oil Breast pump type: Double-Electric Breast Pump   Consult Status Consult Status: Follow-up Date: 11/12/18 Follow-up type: In-patient    Broadus John 11/11/2018, 10:54 AM

## 2018-11-12 ENCOUNTER — Encounter: Payer: BC Managed Care – PPO | Admitting: Certified Nurse Midwife

## 2018-11-18 ENCOUNTER — Encounter (HOSPITAL_COMMUNITY): Payer: Self-pay | Admitting: *Deleted

## 2018-11-18 ENCOUNTER — Inpatient Hospital Stay (HOSPITAL_COMMUNITY)
Admission: AD | Admit: 2018-11-18 | Discharge: 2018-11-18 | Disposition: A | Discharge status: Home / Self Care | Payer: BC Managed Care – PPO | Attending: Obstetrics and Gynecology | Admitting: Obstetrics and Gynecology

## 2018-11-18 ENCOUNTER — Other Ambulatory Visit: Payer: Self-pay

## 2018-11-18 DIAGNOSIS — N9489 Other specified conditions associated with female genital organs and menstrual cycle: Secondary | ICD-10-CM

## 2018-11-18 DIAGNOSIS — Z30431 Encounter for routine checking of intrauterine contraceptive device: Secondary | ICD-10-CM

## 2018-11-18 DIAGNOSIS — R102 Pelvic and perineal pain: Secondary | ICD-10-CM | POA: Insufficient documentation

## 2018-11-18 NOTE — MAU Provider Note (Signed)
History     CSN: [id number]  Arrival date and time: 11/18/18 1336   First Provider Initiated Contact with Patient 11/18/18 1358      Chief Complaint  Patient presents with  . Pelvic Pain   Ms. Gabriella Ingram is a 18 y.o. G1P0102 at Ppartum who presents to MAU for possible IUD expulsion/removal. Pt reports for the past week she has been experiencing bleeding and cramping and is concerned that this is related to her IUD. Pt reports she went to the bathroom this morning and felt on the outside of her vagina and could feel the plastic of the IUD coming out. Pt states she would like her IUD removed regardless of whether or not it is in the right place because she believes it is causing her problems. Pt reports she had seen her primary care provider who prescribed her Tri-Sprintec and advised her to come to MAU for IUD removal. Pt is s/p uncomplicated SVD of twins on 11/08/2018 with immediate ppartum IUD insertion of Liletta.  Of note, pt was texting on her phone almost the entire time the provider was present.   OB History    Gravida  1   Para  1   Term      Preterm  1   AB      Living  2     SAB      TAB      Ectopic      Multiple  1   Live Births  2           Past Medical History:  Diagnosis Date  . Chlamydia    this pregnancy  . 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 NO PAST SURGERIES      Family History  Problem Relation Age of Onset  . Anxiety disorder Mother   . Depression Mother   . Cancer Mother   . Miscarriages / [place] Mother   . Arthritis Maternal Grandmother   . Hypertension Maternal Grandmother   . Arthritis Maternal Grandfather   . Diabetes Maternal Grandfather   . Cancer Maternal Grandfather   . Hypertension Maternal Grandfather     Social History   Tobacco Use  . Smoking status: Never Smoker  . Smokeless tobacco: Never Used  Substance Use Topics  . Alcohol use: No   . Drug use: No    Allergies:  Allergies  Allergen Reactions  . Other     Food allergy -tomatoes- rash, hives, SOB Dove soap- rash    No medications prior to admission.    Review of Systems  Constitutional: Negative for chills, diaphoresis, fatigue and fever.  Respiratory: Negative for shortness of breath.   Cardiovascular: Negative for chest pain.  Gastrointestinal: Negative for abdominal pain, constipation, diarrhea, nausea and vomiting.  Genitourinary: Positive for pelvic pain and vaginal bleeding. Negative for dysuria, flank pain, frequency, urgency and vaginal discharge.  Neurological: Negative for dizziness, weakness, light-headedness and headaches.   Physical Exam   Blood pressure 123/86, pulse 91, temperature 98.3 F (36.8 C), resp. rate 18, height 5\' 5"  (1.651 m), weight 143 lb (64.9 kg), unknown if currently breastfeeding.  Patient Vitals for the past 24 hrs:  BP Temp Pulse Resp Height Weight  11/18/18 1354 123/86 98.3 F (36.8 C) 91 18 5\' 5"  (1.651 m) 143 lb (64.9 kg)   Physical Exam  Constitutional: She is oriented to person, place, and time. She appears well-developed and well-nourished.  No distress.  HENT:  Head: Normocephalic and atraumatic.  Respiratory: Effort normal.  GI: Soft. She exhibits no distension and no mass. There is no abdominal tenderness. There is no rebound and no guarding.  Genitourinary: There is no rash, tenderness or lesion on the right labia. There is no rash, tenderness or lesion on the left labia. Uterus is not tender. Cervix exhibits no motion tenderness, no discharge and no friability. Right adnexum displays no mass, no tenderness and no fullness. Left adnexum displays no mass, no tenderness and no fullness.    Vaginal bleeding present.     No vaginal discharge or tenderness.  There is bleeding in the vagina. No tenderness in the vagina.    Genitourinary Comments: On speculum exam, body of IUD not visualized in os. Multiple Nabothian  cysts present surrounding external os. On bimanual exam, body of IUD not able to be palpated. 2 blue IUD strings visualized, long in length, but not extending past the vaginal introitus.   Neurological: She is alert and oriented to person, place, and time.  Skin: Skin is warm and dry. She is not diaphoretic.  Psychiatric: She has a normal mood and affect. Her behavior is normal. Judgment and thought content normal.   No results found for this or any previous visit (from the past 24 hour(s)).  [place] Mfm Ob Follow Up  Result Date: 10/28/2018 ----------------------------------------------------------------------  OBSTETRICS REPORT                       (Signed Final 10/28/2018 03:33 pm) ---------------------------------------------------------------------- Patient Info  ID #:       [id number]                          D.O.B.:  [birth year] (18 yrs)  Name:       Gabriella Ingram                    Visit Date: 10/28/2018 02:37 pm ---------------------------------------------------------------------- Performed By  Performed By:     Earley Brooke     Ref. Address:     [address], [place]                                                             [address]                                                             [place], [place]                                                             [address]  Attending:        Lin Landsman      Location:  [facility]                    MD                                       [facility]  Referred By:      Hermina Staggers                    MD ---------------------------------------------------------------------- Orders   #  Description                          Code         Ordered By   1  [place] MFM OB FOLLOW UP                  01027.25     Lin Landsman   2  [place] MFM OB FOLLOW UP ADDL             36644.03     Jettie Pagan   ----------------------------------------------------------------------   #  Order #                    Accession #                 Episode #   1  [id number]                  [id number]   2  [id number]                  [id number]                  [id number]  ---------------------------------------------------------------------- Indications   Twin pregnancy, di/di, second trimester (low   O30.042   risk NIPS)   [redacted] weeks gestation of pregnancy                Z3A.34   Encounter for other antenatal screening        Z36.2   follow-up  ---------------------------------------------------------------------- Vital Signs                                                 Height:        5'6" ---------------------------------------------------------------------- Fetal Evaluation (Fetus A)  Num Of Fetuses:         2  Fetal Heart Rate(bpm):  149  Cardiac Activity:       Observed  Fetal Lie:  Maternal right side  Presentation:           Cephalic  Placenta:               Posterior  P. Cord Insertion:      Previously Visualized  Membrane Desc:      Dividing Membrane seen - Dichorionic.  Amniotic Fluid  AFI FV:      Subjectively low-normal                              Largest Pocket(cm)                              5.18 ---------------------------------------------------------------------- Biometry (Fetus A)  BPD:      89.4  mm     G. Age:  36w 1d         91  %    CI:        77.96   %    70 - 86                                                          FL/HC:      19.3   %    19.4 - 21.8  HC:      320.4  mm     G. Age:  36w 1d         58  %    HC/AC:      1.05        0.96 - 1.11  AC:      305.4  mm     G. Age:  34w 4d         57  %    FL/BPD:     69.2   %    71 - 87  FL:       61.9  mm     G. Age:  32w 0d        < 3  %    FL/AC:      20.3   %    20 - 24  HUM:      56.9  mm     G. Age:  33w 0d         33  %  LV:        3.6  mm  Est. FW:    2363  gm      5 lb 3 oz     55  %     FW Discordancy     0 \ 10 %  ---------------------------------------------------------------------- OB History  Gravidity:    1 ---------------------------------------------------------------------- Gestational Age (Fetus A)  LMP:           34w 3d        Date:  03/01/18                 EDD:   12/06/18  U/S Today:     34w 5d                                        EDD:   12/04/18  Best:          34w 3d     Det. By:  LMP  (03/01/18)          EDD:   12/06/18 ---------------------------------------------------------------------- Anatomy (Fetus A)  Cranium:               Appears normal         LVOT:                   Previously seen  Cavum:                 Previously seen        Aortic Arch:            Previously seen  Ventricles:            Appears normal         Ductal Arch:            Previously seen  Choroid Plexus:        Previously seen        Diaphragm:              Appears normal  Cerebellum:            Previously seen        Stomach:                Appears normal, left                                                                        sided  Posterior Fossa:       Previously seen        Abdomen:                Previously seen  Nuchal Fold:           Previously seen        Abdominal Wall:         Previously seen  Face:                  Orbits and profile     Cord Vessels:           Previously seen                         previously seen  Lips:                  Previously seen        Kidneys:                Appear normal  Palate:                Previously seen        Bladder:                Appears normal  Thoracic:              Previously seen        Spine:                  Previously seen  Heart:  Appears normal         Upper Extremities:      Previously seen                         (4CH, axis, and                         situs)  RVOT:                  Previously seen        Lower Extremities:      Previously seen ---------------------------------------------------------------------- Fetal Evaluation (Fetus B)  Num Of  Fetuses:         2  Fetal Heart Rate(bpm):  130  Cardiac Activity:       Observed  Fetal Lie:              Maternal left side  Presentation:           Breech  Placenta:               Anterior  P. Cord Insertion:      Previously Visualized  Membrane Desc:      Dividing Membrane seen - Dichorionic.  Amniotic Fluid  AFI FV:      Within normal limits                              Largest Pocket(cm)                              6.48 ---------------------------------------------------------------------- Biometry (Fetus B)  BPD:      84.8  mm     G. Age:  34w 1d         41  %    CI:        74.39   %    70 - 86                                                          FL/HC:      19.8   %    19.4 - 21.8  HC:      312.1  mm     G. Age:  34w 6d         27  %    HC/AC:      1.07        0.96 - 1.11  AC:      292.6  mm     G. Age:  33w 2d         23  %    FL/BPD:     72.9   %    71 - 87  FL:       61.8  mm     G. Age:  32w 0d        < 3  %    FL/AC:      21.1   %    20 - 24  HUM:      56.5  mm     G. Age:  32w 6d         28  %  LV:  4.7  mm  Est. FW:    2131  gm    4 lb 11 oz      35  %     FW Discordancy        10  % ---------------------------------------------------------------------- Gestational Age (Fetus B)  LMP:           34w 3d        Date:  03/01/18                 EDD:   12/06/18  U/S Today:     33w 4d                                        EDD:   12/12/18  Best:          34w 3d     Det. By:  LMP  (03/01/18)          EDD:   12/06/18 ---------------------------------------------------------------------- Anatomy (Fetus B)  Cranium:               Appears normal         LVOT:                   Previously seen  Cavum:                 Previously seen        Aortic Arch:            Previously seen  Ventricles:            Appears normal         Ductal Arch:            Previously seen  Choroid Plexus:        Previously seen        Diaphragm:              Appears normal  Cerebellum:            Previously seen        Stomach:                 Appears normal, left                                                                        sided  Posterior Fossa:       Previously seen        Abdomen:                Previously seen  Nuchal Fold:           Previously seen        Abdominal Wall:         Previously seen  Face:                  Orbits and profile     Cord Vessels:           Previously seen                         previously seen  Lips:  Previously seen        Kidneys:                Appear normal  Palate:                Previously seen        Bladder:                Appears normal  Thoracic:              Previously seen        Spine:                  Previously seen  Heart:                 Appears normal         Upper Extremities:      Previously seen                         (4CH, axis, and                         situs)  RVOT:                  Previously seen        Lower Extremities:      Previously seen ---------------------------------------------------------------------- Cervix Uterus Adnexa  Cervix  Not visualized (advanced GA >24wks) ---------------------------------------------------------------------- Impression  Dichorionic diamniotic twin gestation with appropriate interval  growth and no significant discordancy. ---------------------------------------------------------------------- Recommendations  Continue daily kick counts  Delivery between 37-38 weeks  Consider follow up growth in 3 weeks with fetal presentation. ----------------------------------------------------------------------               Lin Landsman, MD Electronically Signed Final Report   10/28/2018 03:33 pm ----------------------------------------------------------------------  [place] Mfm Ob Follow Up Addl Gest  Result Date: 10/28/2018 ----------------------------------------------------------------------  OBSTETRICS REPORT                       (Signed Final 10/28/2018 03:33 pm)  ---------------------------------------------------------------------- Patient Info  ID #:       [id number]                          D.O.B.:  07/28/2000 (18 yrs)  Name:       Gabriella Ingram                    Visit Date: 10/28/2018 02:37 pm ---------------------------------------------------------------------- Performed By  Performed By:     Earley Brooke     Ref. Address:     [address], [address]                                                             Road  [place], [place]                                                             [address]  Attending:        Lin Landsman      Location[facility]                    MD                                       [facility]  Referred By:      Hermina Staggers                    MD ---------------------------------------------------------------------- Orders   #  Description                          Code         Ordered By   1  [place] MFM OB FOLLOW UP                  57322.02     Lin Landsman   2  [place] MFM OB FOLLOW UP ADDL             54270.62     Jettie Pagan  ----------------------------------------------------------------------   #  Order #                    Accession #                 Episode #   1  [id number]                  [id number]   2  [id number]                  [id number]                  [id number]  ---------------------------------------------------------------------- Indications   Twin pregnancy, di/di, second trimester (low   O30.042   risk NIPS)   [redacted] weeks gestation of pregnancy                Z3A.34   Encounter for other antenatal screening        Z36.2   follow-up  ---------------------------------------------------------------------- Vital Signs  Height:        5'6"  ---------------------------------------------------------------------- Fetal Evaluation (Fetus A)  Num Of Fetuses:         2  Fetal Heart Rate(bpm):  149  Cardiac Activity:       Observed  Fetal Lie:              Maternal right side  Presentation:           Cephalic  Placenta:               Posterior  P. Cord Insertion:      Previously Visualized  Membrane Desc:      Dividing Membrane seen - Dichorionic.  Amniotic Fluid  AFI FV:      Subjectively low-normal                              Largest Pocket(cm)                              5.18 ---------------------------------------------------------------------- Biometry (Fetus A)  BPD:      89.4  mm     G. Age:  36w 1d         91  %    CI:        77.96   %    70 - 86                                                          FL/HC:      19.3   %    19.4 - 21.8  HC:      320.4  mm     G. Age:  36w 1d         58  %    HC/AC:      1.05        0.96 - 1.11  AC:      305.4  mm     G. Age:  34w 4d         57  %    FL/BPD:     69.2   %    71 - 87  FL:       61.9  mm     G. Age:  32w 0d        < 3  %    FL/AC:      20.3   %    20 - 24  HUM:      56.9  mm     G. Age:  33w 0d         33  %  LV:        3.6  mm  Est. FW:    2363  gm      5 lb 3 oz     55  %     FW Discordancy     0 \ 10 % ---------------------------------------------------------------------- OB History  Gravidity:    1 ---------------------------------------------------------------------- Gestational Age (Fetus A)  LMP:           34w 3d        Date:  03/01/18  EDD:   12/06/18  U/S Today:     34w 5d                                        EDD:   12/04/18  Best:          34w 3d     Det. By:  LMP  (03/01/18)          EDD:   12/06/18 ---------------------------------------------------------------------- Anatomy (Fetus A)  Cranium:               Appears normal         LVOT:                   Previously seen  Cavum:                 Previously seen        Aortic Arch:            Previously seen  Ventricles:             Appears normal         Ductal Arch:            Previously seen  Choroid Plexus:        Previously seen        Diaphragm:              Appears normal  Cerebellum:            Previously seen        Stomach:                Appears normal, left                                                                        sided  Posterior Fossa:       Previously seen        Abdomen:                Previously seen  Nuchal Fold:           Previously seen        Abdominal Wall:         Previously seen  Face:                  Orbits and profile     Cord Vessels:           Previously seen                         previously seen  Lips:                  Previously seen        Kidneys:                Appear normal  Palate:                Previously seen        Bladder:                Appears normal  Thoracic:  Previously seen        Spine:                  Previously seen  Heart:                 Appears normal         Upper Extremities:      Previously seen                         (4CH, axis, and                         situs)  RVOT:                  Previously seen        Lower Extremities:      Previously seen ---------------------------------------------------------------------- Fetal Evaluation (Fetus B)  Num Of Fetuses:         2  Fetal Heart Rate(bpm):  130  Cardiac Activity:       Observed  Fetal Lie:              Maternal left side  Presentation:           Breech  Placenta:               Anterior  P. Cord Insertion:      Previously Visualized  Membrane Desc:      Dividing Membrane seen - Dichorionic.  Amniotic Fluid  AFI FV:      Within normal limits                              Largest Pocket(cm)                              6.48 ---------------------------------------------------------------------- Biometry (Fetus B)  BPD:      84.8  mm     G. Age:  34w 1d         41  %    CI:        74.39   %    70 - 86                                                          FL/HC:      19.8   %    19.4 - 21.8  HC:       312.1  mm     G. Age:  34w 6d         27  %    HC/AC:      1.07        0.96 - 1.11  AC:      292.6  mm     G. Age:  33w 2d         23  %    FL/BPD:     72.9   %    71 - 87  FL:       61.8  mm     G. Age:  32w 0d        < 3  %    FL/AC:  21.1   %    20 - 24  HUM:      56.5  mm     G. Age:  32w 6d         28  %  LV:        4.7  mm  Est. FW:    2131  gm    4 lb 11 oz      35  %     FW Discordancy        10  % ---------------------------------------------------------------------- Gestational Age (Fetus B)  LMP:           34w 3d        Date:  03/01/18                 EDD:   12/06/18  U/S Today:     33w 4d                                        EDD:   12/12/18  Best:          34w 3d     Det. By:  LMP  (03/01/18)          EDD:   12/06/18 ---------------------------------------------------------------------- Anatomy (Fetus B)  Cranium:               Appears normal         LVOT:                   Previously seen  Cavum:                 Previously seen        Aortic Arch:            Previously seen  Ventricles:            Appears normal         Ductal Arch:            Previously seen  Choroid Plexus:        Previously seen        Diaphragm:              Appears normal  Cerebellum:            Previously seen        Stomach:                Appears normal, left                                                                        sided  Posterior Fossa:       Previously seen        Abdomen:                Previously seen  Nuchal Fold:           Previously seen        Abdominal Wall:         Previously seen  Face:                  Orbits and profile  Cord Vessels:           Previously seen                         previously seen  Lips:                  Previously seen        Kidneys:                Appear normal  Palate:                Previously seen        Bladder:                Appears normal  Thoracic:              Previously seen        Spine:                  Previously seen  Heart:                 Appears normal          Upper Extremities:      Previously seen                         (4CH, axis, and                         situs)  RVOT:                  Previously seen        Lower Extremities:      Previously seen ---------------------------------------------------------------------- Cervix Uterus Adnexa  Cervix  Not visualized (advanced GA >24wks) ---------------------------------------------------------------------- Impression  Dichorionic diamniotic twin gestation with appropriate interval  growth and no significant discordancy. ---------------------------------------------------------------------- Recommendations  Continue daily kick counts  Delivery between 37-38 weeks  Consider follow up growth in 3 weeks with fetal presentation. ----------------------------------------------------------------------               Lin Landsman, MD Electronically Signed Final Report   10/28/2018 03:33 pm ----------------------------------------------------------------------  MAU Course  Procedures  MDM -IUD check -speculum and bimanual exam support IUD is in appropriate, fundal position -discussed [place] with pt for confirmation of appropriate placement of IUD, pt declines inpatient and outpatient [place] for IUD placement check -discussed benefits of keeping IUD in place while caring for newborn twins -discussed expected postpartum bleeding/cramping, discussed expected side effects of IUD post-insertion -discussed risks of COC use in early postpartum phase and encouraged patient not to use COCs prescribed by primary care and to contact OB to discuss other birth control options if she continues to be dissatisfied with IUD -pt advised that, unless malpositioned, IUD could not be removed in MAU for elective purposes, per MAU guidelines -pt reassured that IUD appears to be in appropriate position at this time -pt satisfied with exam and states she would like to keep the IUD at this time -pt discharged to home in stable  condition  Orders Placed This Encounter  Procedures  . Discharge patient    Order Specific Question:   Discharge disposition    Answer:   01-Home or Self Care [1]    Order Specific Question:   Discharge patient date    Answer:   11/18/2018   No orders of the defined types were placed in this encounter.  Assessment and Plan   1. IUD check up   2. Postpartum hemorrhage, unspecified type   3. Uterine cramping    Allergies as of 11/18/2018      Reactions   Other    Food allergy -tomatoes- rash, hives, SOB Dove soap- rash      Medication List    TAKE these medications   acetaminophen 325 MG tablet Commonly known as: Tylenol Take 2 tablets (650 mg total) by mouth every 4 (four) hours as needed (for pain scale < 4).   aspirin EC 81 MG tablet Take 1 tablet (81 mg total) by mouth daily. Take after 12 weeks for prevention of preeclampsia later in pregnancy   ibuprofen 600 MG tablet Commonly known as: ADVIL Take 1 tablet (600 mg total) by mouth every 6 (six) hours.   Prenate Pixie 10-0.6-0.4-200 MG Caps Take 1 capsule by mouth daily.      -discussed normal expectations for ppartum bleeding/cramping and post-insertion expectations for IUD -strict expulsion/VB/pain/return MAU precautions given -pt discharged to home in stable condition  Zuleima Haser, Odie Sera, NP  9:44 AM 11/19/2018

## 2018-11-18 NOTE — MAU Note (Signed)
Pt reports she is PP 1 week. Had IUD placed. Stated that it is not in there right she is having pain and craping and when she went to the bathroom she when she wiped she thought she could feel it sitting there.

## 2018-11-18 NOTE — Discharge Instructions (Signed)

## 2018-11-23 ENCOUNTER — Other Ambulatory Visit: Payer: Self-pay

## 2018-11-23 ENCOUNTER — Inpatient Hospital Stay (HOSPITAL_COMMUNITY)
Admission: AD | Admit: 2018-11-23 | Discharge: 2018-11-23 | Disposition: A | Discharge status: Home / Self Care | Payer: BC Managed Care – PPO | Attending: Obstetrics and Gynecology | Admitting: Obstetrics and Gynecology

## 2018-11-23 DIAGNOSIS — T8389XA Other specified complication of genitourinary prosthetic devices, implants and grafts, initial encounter: Secondary | ICD-10-CM | POA: Insufficient documentation

## 2018-11-23 NOTE — MAU Note (Signed)
After Janett Billow, CNM  Assessed  pt and  Explained  The plan - I asked her to wait in lobby - as Janett Billow, CNM asked.   I explained that I would bring her to a room  ASAP  For continued care.   The pt  Was nice,   But then became upset  With the plan- she wanted her IUD removed .  I tried to console her  But she  Did not like the plan- which Janett Billow had told her she may not remove it .  I explained that the plan was between the two of them - not me.  Someone was on the phone in the lobby. Pt wanted mine and Jessica's name  To report.   Then someone called the unit - [facility] spoke with her.

## 2018-11-23 NOTE — MAU Note (Signed)
PT SAYS SHE DEL VAG TWINS - AT 36 WEEKS ON 11-08-18.    AFTER DEL SHE GOT IUD THEN 1 WEEK AGO  SHE CAME HERE- ALL OK.    THEN AT 730- IN B-ROOM - THE STRINGS ARE HANGING OUT .    SHE  FEELS CRAMPS- AT 730PM.

## 2018-11-23 NOTE — MAU Provider Note (Signed)
First Provider Initiated Contact with Patient 11/23/18 2031      S Ms. Alexandr Oehler is a 18 y.o. G3P0102 female at 37 days postpartum  who presents to MAU today with complaint of IUD complications.  She states that she had an PP IUD placed after delivery and the strings are now hanging from her vagina.  Patient states she was seen last week because she thought the strings were hanging out, but was told otherwise.  Patient now states she would like the IUD removed as she feels it is causing her "too many problems." She states she has also been having cramping and some bleeding.   O BP 128/80 (BP Location: Right Arm)   Pulse 84   Temp 98.2 F (36.8 C) (Oral)   Resp 20   Ht 5\' 6"  (1.676 m)   Wt 64.5 kg   BMI 22.94 kg/m  Physical Exam  Constitutional: She is oriented to person, place, and time. She appears well-developed and well-nourished. No distress.  HENT:  Head: Normocephalic and atraumatic.  Eyes: Conjunctivae are normal.  Neck: Normal range of motion.  Cardiovascular: Normal rate.  Respiratory: Effort normal.  Neurological: She is alert and oriented to person, place, and time.  Psychiatric: She has a normal mood and affect. Her behavior is normal.  Bright affect when showing pictures of twins "Ace and Marjory Lies" to provider.    A S/P PP IUD 15 days postpartum Medical screening exam complete  P -Patient informed that IUD string protruding from her introitus would be a situation that is better suited for the office.   -Provider offered patient an appt tomorrow morning at the [facility] office for evaluation. -Patient reports that she is moving to "[employer] and will not be available. -Patient given option for pelvic exam with evaluation of IUD and if it remains in place provider could trim strings and patient informed that she would be scheduled for an outpatient follow up US to confirm placement.   -Patient states "please, I will get on my knees and beg" when requesting  provider to remove IUD today. -Patient informed that "begging" is not necessary and that provider would consult regarding IUD procedures.  -Patient instructed to wait while provider consults.  -Consult with L. Leftwich-Kirby, CNM who confirms that IUD can be evaluated and removed if displaced.  However, if IUD not visualized or palpated in cervix provider to leave IUD in place and have patient follow up in office.  -Nurse instructed to let patient know that she will be going to a room for evaluation of IUD placement, but would have to remain in lobby until room available. -Nurse returns after provider returns from other patient care responsibilities and reports patient states she does not want a pelvic exam if provider unable to remove IUD today. -Nurse further states patient became irritate and was on the phone with someone who identified herself as patient's mother and left AMA.   Gavin Pound, [place] 11/23/2018 8:31 PM

## 2018-11-26 ENCOUNTER — Ambulatory Visit: Payer: BC Managed Care – PPO

## 2018-12-17 ENCOUNTER — Encounter: Payer: Self-pay | Admitting: Certified Nurse Midwife

## 2018-12-17 ENCOUNTER — Other Ambulatory Visit: Payer: Self-pay

## 2018-12-17 ENCOUNTER — Ambulatory Visit (INDEPENDENT_AMBULATORY_CARE_PROVIDER_SITE_OTHER): Payer: BC Managed Care – PPO | Admitting: Certified Nurse Midwife

## 2018-12-17 DIAGNOSIS — Z3009 Encounter for other general counseling and advice on contraception: Secondary | ICD-10-CM

## 2018-12-17 DIAGNOSIS — Z30011 Encounter for initial prescription of contraceptive pills: Secondary | ICD-10-CM

## 2018-12-17 MED ORDER — NORETHINDRONE 0.35 MG PO TABS: 1.0000 | ORAL_TABLET | Freq: Every day | ORAL | 3 refills | Status: DC

## 2018-12-17 NOTE — Patient Instructions (Signed)

## 2018-12-17 NOTE — Progress Notes (Signed)
Post Partum Exam  Gabriella Ingram is a 18 y.o. G10P0102 female who presents for a postpartum visit. She is 5 weeks postpartum following a spontaneous vaginal delivery. I have fully reviewed the prenatal and intrapartum course. The delivery was at 36.0 gestational weeks.  Anesthesia: epidural. Postpartum course has been good. Baby's course has been good. Baby is feeding by breast. Bleeding no bleeding. Bowel function is normal. Bladder function is normal. Patient is not sexually active. Contraception method is none. Postpartum depression screening:neg  The following portions of the patient's history were reviewed and updated as appropriate: allergies, current medications, past surgical history and problem list. Patient is <21yo, no pap needed  Review of Systems Pertinent items noted in HPI and remainder of comprehensive ROS otherwise negative.    Objective:  unknown if currently breastfeeding.  General:  alert, cooperative and no distress   Breasts:  inspection negative, no nipple discharge or bleeding, no masses or nodularity palpable  Lungs: clear to auscultation bilaterally  Heart:  regular rate and rhythm  Abdomen: soft, non-tender; bowel sounds normal; no masses,  no organomegaly   Vulva:  not evaluated  Vagina: not evaluated  Cervix:  not evaluated  Corpus: not examined  Adnexa:  not evaluated  Rectal Exam: Not performed.        Assessment/Plan:  1. Postpartum care and examination - Normal postpartum exam. Pap smear not done at today's visit.  2. Birth control counseling - Pt states that on 11-25-18 she urinated and IUD fell out in the toilet, pt does not want another one, will discuss BC options with provider. - Educated and discussed birth control options in detail with patient including r/b and side effects of each. - Patient reports being on Tri-Sprintec pills prior to conceiving, pregnancy was not an accident she stopped medication in order to conceive  - Patient decided on  birth control pills for contraception method   3. Encounter for initial prescription of contraceptive pills - norethindrone (MICRONOR) 0.35 MG tablet; Take 1 tablet (0.35 mg total) by mouth daily.  Dispense: 3 Package; Refill: 3 - Patient currently breastfeeding, started on POPs- encouraged to call in 3-4 months to be switched to OCPs (TriSprintec) if she wants or wait until annual examination next year, patient verbalizes understanding    Contraception: oral progesterone-only contraceptive Follow up in: 1 years for well woman examination or as needed.   Lajean Manes, CNM 12/17/18, 11:16 AM

## 2018-12-21 ENCOUNTER — Telehealth: Payer: Self-pay

## 2018-12-21 DIAGNOSIS — F53 Postpartum depression: Secondary | ICD-10-CM

## 2018-12-21 NOTE — Telephone Encounter (Signed)
I called patient to follow up with her on her my chart message.  Pt states that over the weekend she started feeling more overwhelmed, and not feeling like she wanted to do anything with the babies. All she wanted to do was sleep. Performed EPDS screening on patient, and she scored a 16. Referral set up for behavioral health with [facility] per Dr. Jodi Mourning

## 2018-12-24 ENCOUNTER — Telehealth (INDEPENDENT_AMBULATORY_CARE_PROVIDER_SITE_OTHER): Payer: BC Managed Care – PPO | Admitting: Clinical

## 2018-12-24 ENCOUNTER — Other Ambulatory Visit: Payer: Self-pay

## 2018-12-24 DIAGNOSIS — F4323 Adjustment disorder with mixed anxiety and depressed mood: Secondary | ICD-10-CM

## 2018-12-24 NOTE — BH Specialist Note (Signed)
Integrated Behavioral Health via Telemedicine Video Visit  12/24/2018 Gabriella Ingram [place] [id number]  Number of Integrated Behavioral Health visits: 1 Session Start time: 3:19  Session End time: 3:52 Total time: 35 minutes  Referring Provider: Coral Ceoharles Harper, MD for depression postpartum Type of Visit: Video Patient/Family location: Home  [facility] All persons participating in visit: Patient Gabriella Ingram Friend and [facility] Berthel Bagnall  Confirmed patient's address: Yes  Confirmed patient's phone number: Yes  Any changes to demographics: No   Confirmed patient's insurance: Yes  Any changes to patient's insurance: No   Discussed confidentiality: Yes   I connected with Gabriella Ingram Latulippe  by a video enabled telemedicine application and verified that I am speaking with the correct person using two identifiers.     I discussed the limitations of evaluation and management by telemedicine and the availability of in person appointments.  I discussed that the purpose of this visit is to provide behavioral health care while limiting exposure to the novel coronavirus.   Discussed there is a possibility of technology failure and discussed alternative modes of communication if that failure occurs.  I discussed that engaging in this video visit, they consent to the provision of behavioral healthcare and the services will be billed under their insurance.  Patient and/or legal guardian expressed understanding and consented to video visit: Yes   PRESENTING CONCERNS: Patient and/or family reports the following symptoms/concerns: Pt states her primary concern is feeling overwhelmed, depression, fatigue, guilt, anxious, and irritable. Pt is sleeping less than 3 hours/night since birth of twins, while living with mother as FOB recovered from COVID19(after the loss of his mother). Pt prefers no medication at this time; says it helps to talk about her feelings. Pt denies SI; says she feels like dying, but  "never think about hurting myself", with no plan, and attributes thoughts of death to fatigue.  Duration of problem: Postpartum; Severity of problem: moderately severe  STRENGTHS (Protective Factors/Coping Skills): Open to talk about feelings; Somewhat supportive friends and family  GOALS ADDRESSED: Patient will: 1.  Reduce symptoms of: anxiety and depression  2.  Increase knowledge and/or ability of: healthy habits  3.  Demonstrate ability to: Increase healthy adjustment to current life circumstances and Increase adequate support systems for patient/family  INTERVENTIONS: Interventions utilized:  Mining engineerBehavioral Activation, Psychoeducation and/or Health Education and Link to [facility] Resources Standardized Assessments completed: GAD-7 and PHQ 2&9 with C-SSRS  ASSESSMENT: Patient currently experiencing Adjustment disorder with mixed anxious and depressed mood.  Patient may benefit from psychoeducation and brief therapeutic interventions regarding coping with symptoms of anxiety and depression.   PLAN: 1. Follow up with behavioral health clinician on : One week, or earlier, if needed 2. Behavioral recommendations:  -Continue taking morning nap when babies sleep; accept offers of help from friends and family -Consider registering for a new mom (and new dad) support group for additional support; consider joining Mothers of Multiples group -Follow Safety Plan discussed in office today -Prioritize self-care this week (will discuss further at next visit) 3. Referral(s): Integrated Art gallery managerBehavioral Health Services (In Clinic) and [employer] Resources:  New mom (and dad) support groups  I discussed the assessment and treatment plan with the patient and/or parent/guardian. They were provided an opportunity to ask questions and all were answered. They agreed with the plan and demonstrated an understanding of the instructions.   They were advised to call back or seek an in-person evaluation if the symptoms  worsen or if the condition fails to improve as anticipated.  Caroleen Hamman Ma Munoz  Depression screen [facility] 2/9 12/24/2018 12/24/2018 11/11/2016  Decreased Interest - 2 1  Down, Depressed, Hopeless - 3 2  PHQ - 2 Score - 5 3  Altered sleeping - 0 -  Tired, decreased energy - 3 -  Change in appetite - 0 -  Feeling bad or failure about yourself  - 3 -  Trouble concentrating - 0 -  Moving slowly or fidgety/restless - 0 -  Suicidal thoughts 1 0 -  PHQ-9 Score - 11 -   GAD 7 : Generalized Anxiety Score 12/24/2018  Nervous, Anxious, on Edge 3  Control/stop worrying 1  Worry too much - different things 2  Trouble relaxing 0  Restless 0  Easily annoyed or irritable 3  Afraid - awful might happen 0  Total GAD 7 Score 9

## 2018-12-31 ENCOUNTER — Telehealth (INDEPENDENT_AMBULATORY_CARE_PROVIDER_SITE_OTHER): Payer: BC Managed Care – PPO | Admitting: Clinical

## 2018-12-31 ENCOUNTER — Other Ambulatory Visit: Payer: Self-pay

## 2018-12-31 DIAGNOSIS — F4323 Adjustment disorder with mixed anxiety and depressed mood: Secondary | ICD-10-CM

## 2018-12-31 NOTE — BH Specialist Note (Signed)
Integrated Behavioral Health via Telemedicine Video Visit  12/31/2018 Amonie Wisser [id number]  Number of [place] visits: 2 Session Start time: 10:44  Session End time: 11:26 Total time: 40 minutes  Referring Provider: Baltazar Najjar, MD Type of Visit: Video Patient/Family location: Home [facility] Provider location: [facility] All persons participating in visit: Patient Gabriella Ingram and [place]  Confirmed patient's address: Yes  Confirmed patient's phone number: Yes  Any changes to demographics: No   Confirmed patient's insurance: Yes  Any changes to patient's insurance: No   Discussed confidentiality: At previous visit  I connected with Reginia Forts by a video enabled telemedicine application and verified that I am speaking with the correct person using two identifiers.     I discussed the limitations of evaluation and management by telemedicine and the availability of in person appointments.  I discussed that the purpose of this visit is to provide behavioral health care while limiting exposure to the novel coronavirus.   Discussed there is a possibility of technology failure and discussed alternative modes of communication if that failure occurs.  I discussed that engaging in this video visit, they consent to the provision of behavioral healthcare and the services will be billed under their insurance.  Patient and/or legal guardian expressed understanding and consented to video visit: Yes   PRESENTING CONCERNS: Patient and/or family reports the following symptoms/concerns: Pt states her primary concern today is interpersonal conflict with FOB, and helps to talk through her feelings regarding that relationshiip. Pt is back living with her mother and sister, who are both very supportive, and she looks forward to a positive future with her boys. Pt no longer having thoughts of death, or feeling overwhelmed.  Duration of problem: Postpartum; Severity of  problem: moderately severe  STRENGTHS (Protective Factors/Coping Skills): Open to talk through feelings; supportive mother and sister  GOALS ADDRESSED: Patient will: 1.  Reduce symptoms of: anxiety and depression  2.  Demonstrate ability to: Increase healthy adjustment to current life circumstances and Increase adequate support systems for patient/family  INTERVENTIONS: Interventions utilized:  Solution-Focused Strategies Standardized Assessments completed: Not Needed  ASSESSMENT: Patient currently experiencing Adjustment disorder with mixed anxious and depressed mood.   Patient may benefit from continued psychoeducation and brief therapeutic interventions regarding coping with symptoms of anxiety and depression .  PLAN: 1. Follow up with behavioral health clinician on : As needed 2. Behavioral recommendations:  -Continue plans to get hair done on Saturday -Continue plans to attend  online classes for spring semester, as a psychology major, by deciding between 2 school offers  -Continue sleeping when baby sleeps, and accepting offers of help from mother and sister -Continue to consider Mothers of Multiples group and/or new mom support groups, as discussed; share information about new dad groups with FOB 3. Referral(s): [place] (In Clinic)  I discussed the assessment and treatment plan with the patient and/or parent/guardian. They were provided an opportunity to ask questions and all were answered. They agreed with the plan and demonstrated an understanding of the instructions.   They were advised to call back or seek an in-person evaluation if the symptoms worsen or if the condition fails to improve as anticipated.  Caroleen Hamman Tyrelle Raczka

## 2019-07-18 ENCOUNTER — Encounter: Payer: Self-pay | Admitting: Certified Nurse Midwife

## 2019-07-19 ENCOUNTER — Other Ambulatory Visit: Payer: Self-pay | Admitting: Certified Nurse Midwife

## 2019-07-19 DIAGNOSIS — Z3049 Encounter for surveillance of other contraceptives: Secondary | ICD-10-CM

## 2019-07-19 MED ORDER — NORGESTIMATE-ETH ESTRADIOL 0.25-35 MG-MCG PO TABS: 1.0000 | ORAL_TABLET | Freq: Every day | ORAL | 3 refills | Status: DC

## 2019-08-23 ENCOUNTER — Encounter: Payer: Self-pay | Admitting: Certified Nurse Midwife

## 2020-05-09 ENCOUNTER — Ambulatory Visit
Admission: RE | Admit: 2020-05-09 | Discharge: 2020-05-09 | Disposition: A | Discharge status: Home / Self Care | Payer: Medicaid Other | Source: Ambulatory Visit | Attending: Emergency Medicine | Admitting: Emergency Medicine

## 2020-05-09 ENCOUNTER — Other Ambulatory Visit: Payer: Self-pay

## 2020-05-09 VITALS — BP 134/84 | HR 74 | Temp 98.3°F | Ht 65.0 in | Wt 141.0 lb

## 2020-05-09 DIAGNOSIS — Z113 Encounter for screening for infections with a predominantly sexual mode of transmission: Secondary | ICD-10-CM | POA: Insufficient documentation

## 2020-05-09 NOTE — Discharge Instructions (Addendum)

## 2020-05-09 NOTE — ED Triage Notes (Signed)
Pt states that she is here for std testing and possible exposure.

## 2020-05-09 NOTE — ED Triage Notes (Signed)
Pt states that she also has some abdominal pain as well. 

## 2020-05-09 NOTE — ED Provider Notes (Signed)
[facility]    CSN: [id number] Arrival date & time: 05/09/20  1127      History   Chief Complaint Chief Complaint  Patient presents with  . Exposure to STD  . appt 12    HPI Gabriella Ingram is a 19 y.o. female  Presenting for STD testing.  Currently sexual active along well partner, not routine using condoms.  States she found out that her partner was cheating on her.  Denies symptoms such as discharge, dysuria.  Past Medical History:  Diagnosis Date  . Chlamydia    this pregnancy  . 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 Preterm premature rupture of membranes (PPROM) with unknown onset of labor 11/08/2018  . Leukocytes in urine 10/09/2018  . Rash 09/03/2018  . Supervision of other normal pregnancy, antepartum 07/24/2018  . Chlamydia infection affecting pregnancy 07/24/2018  . Dichorionic diamniotic twin gestation 07/02/2018    Past Surgical History:  Procedure Laterality Date  . NO PAST SURGERIES      OB History    Gravida  1   Para  1   Term      Preterm  1   AB      Living  2     SAB      IAB      Ectopic      Multiple  1   Live Births  2            Home Medications    Prior to Admission medications   Medication Sig Start Date End Date Taking? Authorizing Provider  acetaminophen (TYLENOL) 325 MG tablet Take 2 tablets (650 mg total) by mouth every 4 (four) hours as needed (for pain scale < 4). Patient not taking: No sig reported 11/10/18   Melene Plan, MD  aspirin EC 81 MG tablet Take 1 tablet (81 mg total) by mouth daily. Take after 12 weeks for prevention of preeclampsia later in pregnancy Patient not taking: No sig reported 07/02/18   Hermina Staggers, MD  ibuprofen (ADVIL) 600 MG tablet Take 1 tablet (600 mg total) by mouth every 6 (six) hours. Patient not taking: No sig reported 11/10/18   Melene Plan, MD  norgestimate-ethinyl estradiol (ORTHO-CYCLEN)  0.25-35 MG-MCG tablet Take 1 tablet by mouth daily. 07/19/19   Sharyon Cable, CNM  Prenat-FeAsp-Meth-FA-DHA w/o A (PRENATE PIXIE) 10-0.6-0.4-200 MG CAPS Take 1 capsule by mouth daily. 09/14/18   Hermina Staggers, MD    Family History Family History  Problem Relation Age of Onset  . Anxiety disorder Mother   . Depression Mother   . Cancer Mother   . Miscarriages / [place] Mother   . Arthritis Maternal Grandmother   . Hypertension Maternal Grandmother   . Arthritis Maternal Grandfather   . Diabetes Maternal Grandfather   . Cancer Maternal Grandfather   . Hypertension Maternal Grandfather     Social History Social History   Tobacco Use  . Smoking status: Never Smoker  . Smokeless tobacco: Never Used  Vaping Use  . Vaping Use: Never used  Substance Use Topics  . Alcohol use: No  . Drug use: No     Allergies   Other   Review of Systems Review of Systems  Constitutional: Negative for fatigue and fever.  Respiratory: Negative for cough and shortness of breath.   Cardiovascular: Negative for chest pain and palpitations.  Gastrointestinal: Negative for constipation and  diarrhea.  Genitourinary: Negative for dysuria, flank pain, frequency, hematuria, pelvic pain, urgency, vaginal bleeding, vaginal discharge and vaginal pain.     Physical Exam Triage Vital Signs ED Triage Vitals  Enc Vitals Group     BP 05/09/20 1226 134/84     Pulse Rate 05/09/20 1226 74     Resp --      Temp 05/09/20 1226 98.3 F (36.8 C)     Temp Source 05/09/20 1226 Oral     SpO2 05/09/20 1226 100 %     Weight 05/09/20 1224 141 lb (64 kg)     Height 05/09/20 1224 5\' 5"  (1.651 m)     Head Circumference --      Peak Flow --      Pain Score --      Pain Loc --      Pain Edu? --      Excl. in GC? --    No data found.  Updated Vital Signs BP 134/84 (BP Location: Left Arm)   Pulse 74   Temp 98.3 F (36.8 C) (Oral)   Ht 5\' 5"  (1.651 m)   Wt 141 lb (64 kg)   LMP 04/20/2020   SpO2  100%   BMI 23.46 kg/m   Visual Acuity Right Eye Distance:   Left Eye Distance:   Bilateral Distance:    Right Eye Near:   Left Eye Near:    Bilateral Near:     Physical Exam Constitutional:      General: She is not in acute distress. HENT:     Head: Normocephalic and atraumatic.  Eyes:     General: No scleral icterus.    Pupils: Pupils are equal, round, and reactive to light.  Cardiovascular:     Rate and Rhythm: Normal rate.  Pulmonary:     Effort: Pulmonary effort is normal.  Abdominal:     General: Bowel sounds are normal.     Palpations: Abdomen is soft.     Tenderness: There is no abdominal tenderness. There is no right CVA tenderness, left CVA tenderness or guarding.  Genitourinary:    Comments: Patient declined, self-swab performed Skin:    Coloration: Skin is not jaundiced or pale.  Neurological:     Mental Status: She is alert and oriented to person, place, and time.      UC Treatments / Results  Labs (all labs ordered are listed, but only abnormal results are displayed) Labs Reviewed - No data to display  EKG   Radiology No results found.  Procedures Procedures (including critical care time)  Medications Ordered in UC Medications - No data to display  Initial Impression / Assessment and Plan / UC Course  I have reviewed the triage vital signs and the nursing notes.  Pertinent labs & imaging results that were available during my care of the patient were reviewed by me and considered in my medical decision making (see chart for details).     Patient asymptomatic, without known exposure.  Cytology pending: We will treat if indicated.  Return precautions discussed, pt verbalized understanding and is agreeable to plan. Final Clinical Impressions(s) / UC Diagnoses   Final diagnoses:  Screening examination for venereal disease     Discharge Instructions     Testing for chlamydia, gonorrhea, trichomonas is pending: please look for these  results on the MyChart app/website.  We will notify you if you are positive and outline treatment at that time.  Important to avoid all forms of sexual  intercourse (oral, vaginal, anal) with any/all partners for the next 7 days to avoid spreading/reinfecting. Any/all sexual partners should be notified of testing/treatment today.  Return for persistent/worsening symptoms or if you develop fever, abdominal or pelvic pain, discharge, genital pain, blood in your urine, or are re-exposed to an STI.    ED Prescriptions    None     PDMP not reviewed this encounter.   Hall[place], [place], [place] 05/09/20 1318

## 2020-05-10 LAB — CERVICOVAGINAL ANCILLARY ONLY
Chlamydia: NEGATIVE
Comment: NEGATIVE
Comment: NEGATIVE
Comment: NORMAL
Neisseria Gonorrhea: NEGATIVE
Trichomonas: NEGATIVE

## 2020-06-20 IMAGING — US US MFM OB FOLLOW UP ADDL GEST
2 of 3 series · 12 of 28 positions shown · non-contrast
Comparison: none

[Series 1: us mfm ob follow up addl gest · 11 of 40 slices shown (1 of 2)]
[im 2/40]
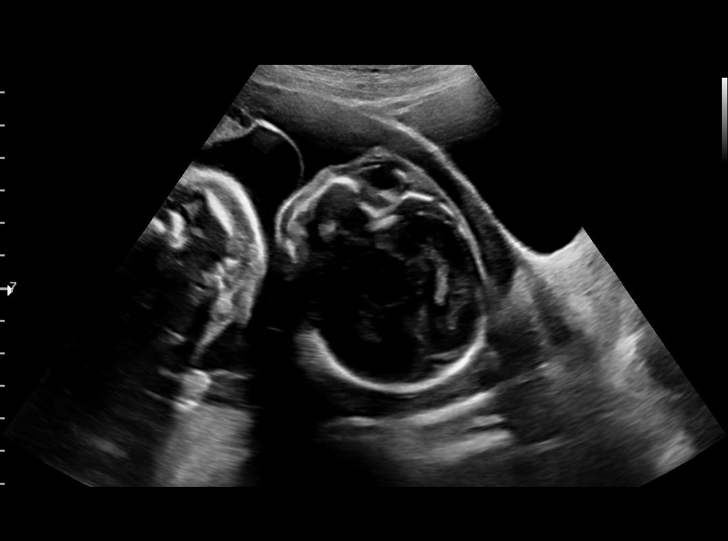
[im 5/40]
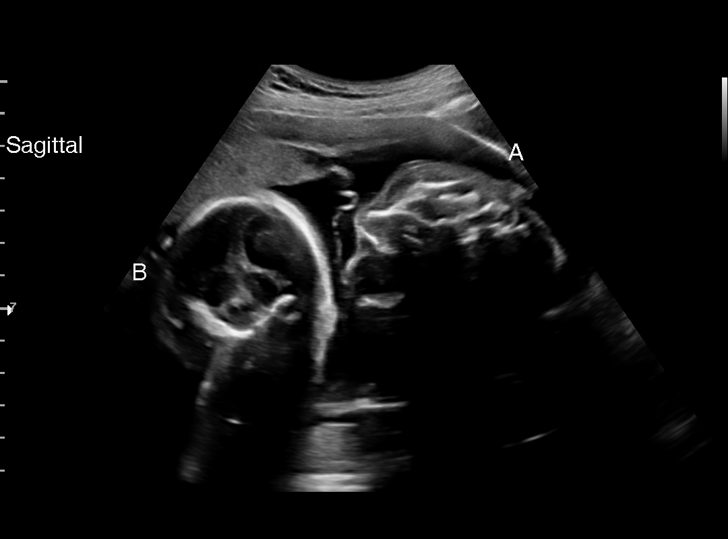
[im 8/40]
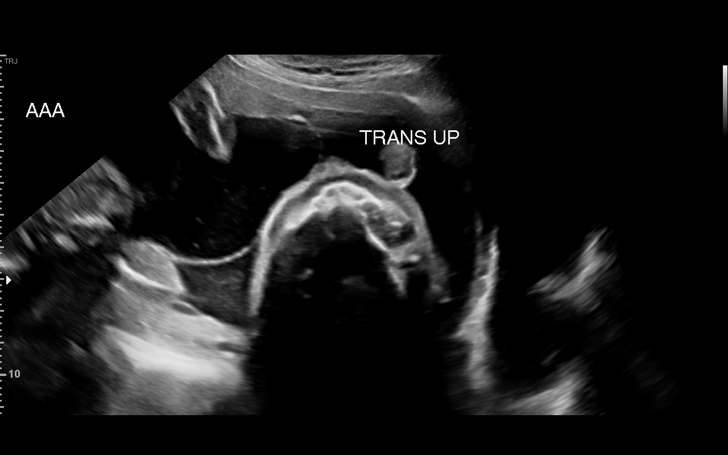
[im 13/40]
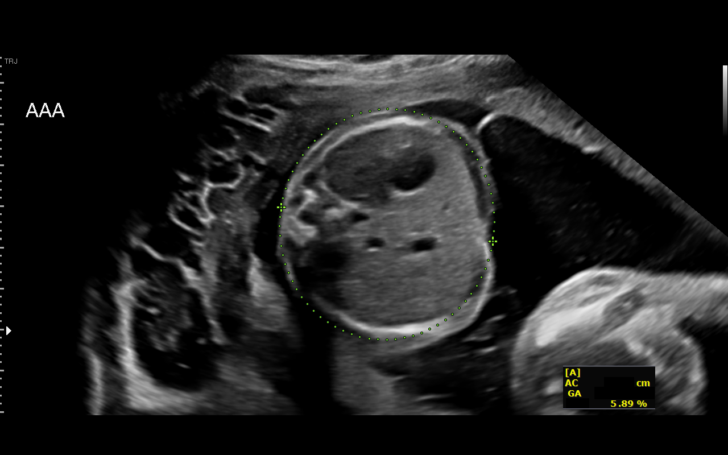
[im 16/40]
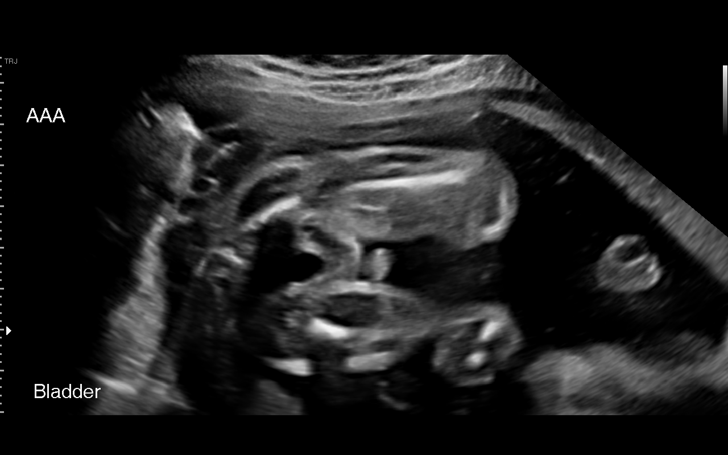
[im 19/40]
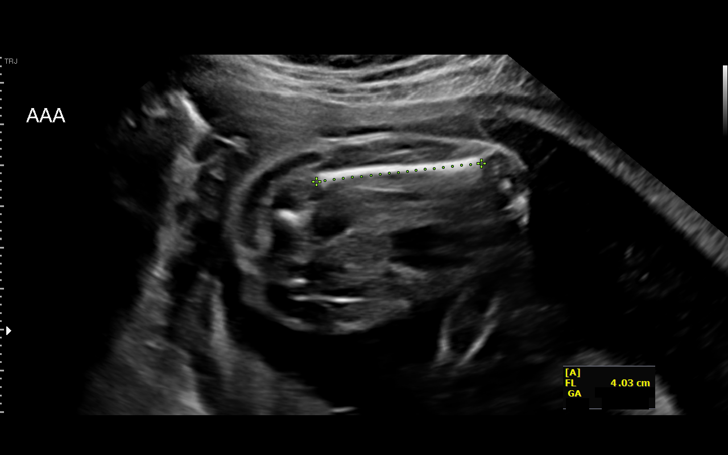
[im 24/40]
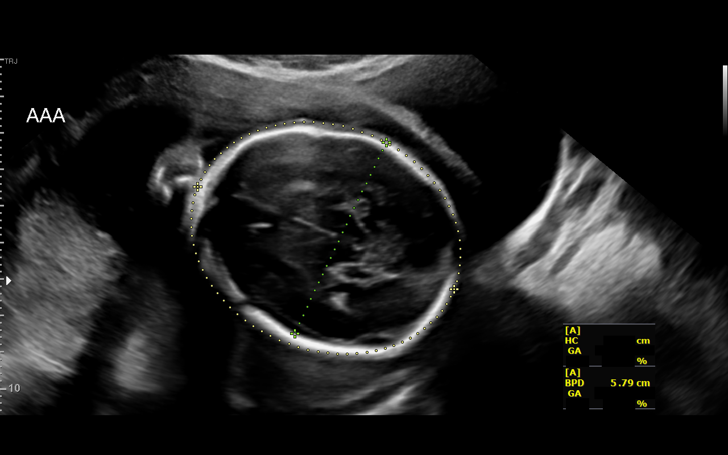
[im 27/40]
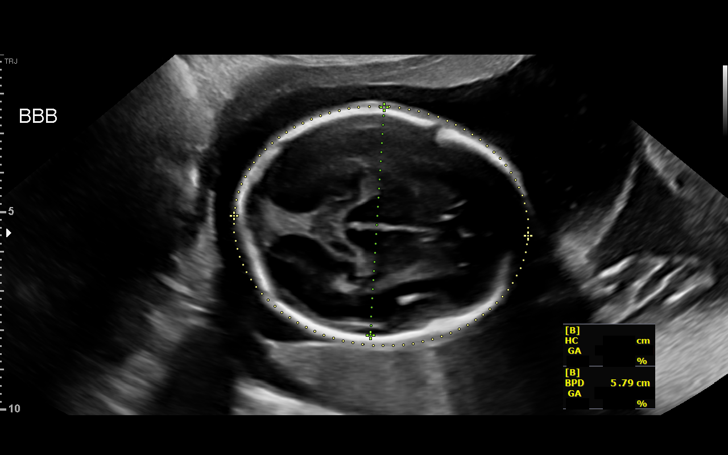
[im 30/40]
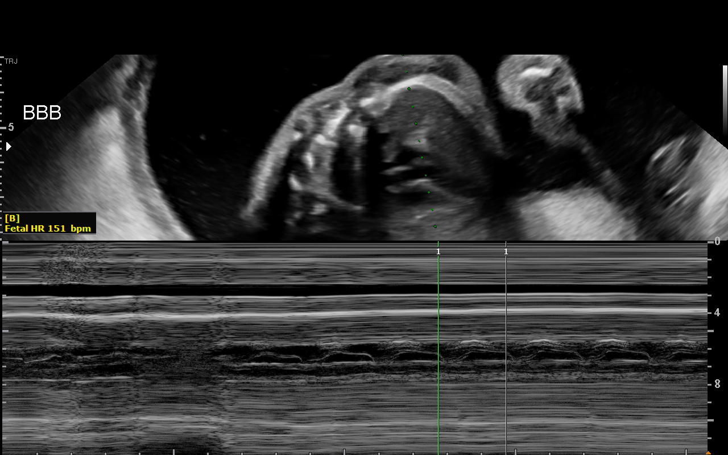
[im 35/40]
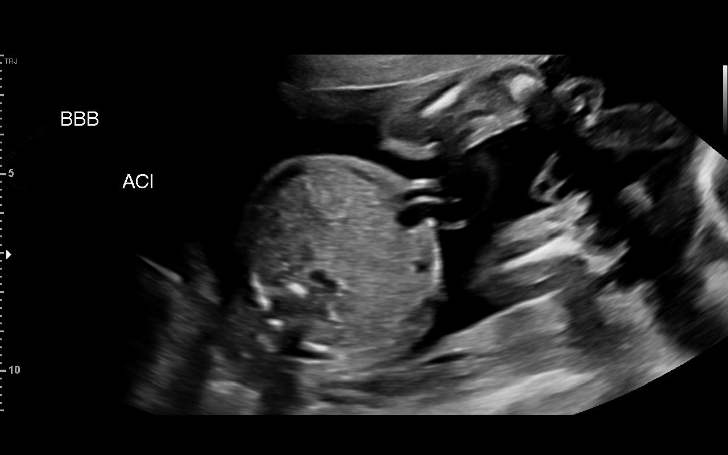
[im 38/40]
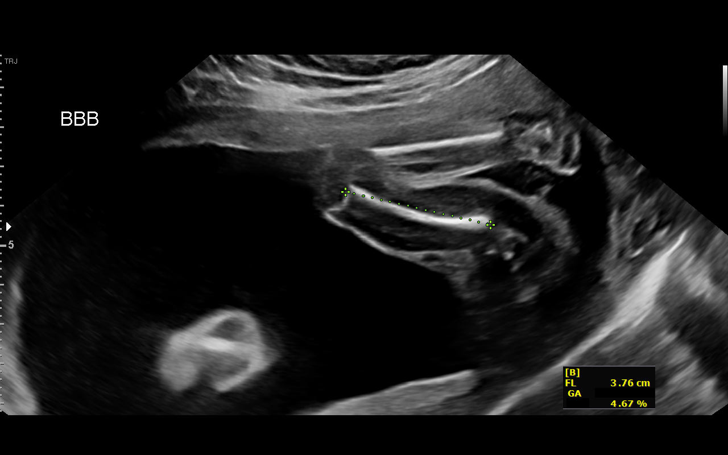

[Series 2: us mfm ob follow up addl gest · 1 of 1 slices shown (2 of 2)]
[im 1/1]
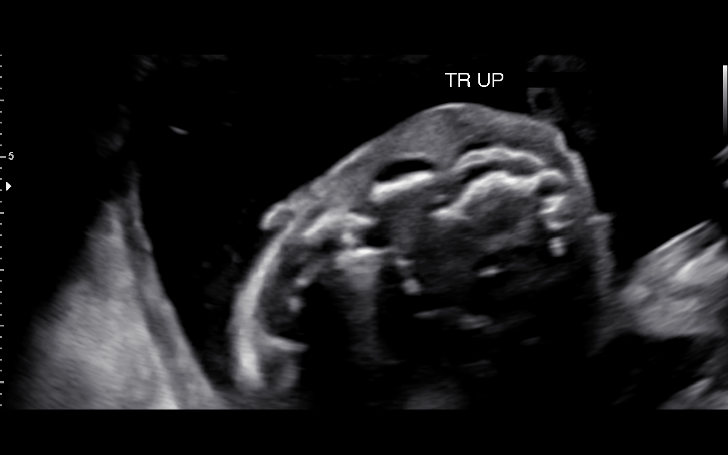

[12 of 28 positions shown; findings below may reference images not displayed]

GEST
 ----------------------------------------------------------------------

 ----------------------------------------------------------------------
Indications

  Twin pregnancy, di/di, second trimester
  Encounter for other antenatal screening
  follow-up
  23 weeks gestation of pregnancy
 ----------------------------------------------------------------------
Vital Signs

                                                 Height:        5'6"
Fetal Evaluation (Fetus A)

 Num Of Fetuses:          2
 Fetal Heart Rate(bpm):   167
 Cardiac Activity:        Observed
 Fetal Lie:               Lower Fetus, Maternal Right
 Presentation:            Cephalic
 Placenta:                Posterior
 P. Cord Insertion:       Previously Visualized
 Membrane Desc:       Dividing Membrane seen - Dichorionic.

 Amniotic Fluid
 AFI FV:      Within normal limits

                             Largest Pocket(cm)

Biometry (Fetus A)

 BPD:      57.6   mm     G. Age:  23w 4d         47  %    CI:         73.01  %    70 - 86
                                                          FL/HC:       18.8  %    18.7 -
 HC:      214.3   mm     G. Age:  23w 4d         31  %    HC/AC:       1.24       1.05 -
 AC:      172.2   mm     G. Age:  22w 1d          9  %    FL/BPD:      69.8  %    71 - 87
 FL:       40.2   mm     G. Age:  23w 0d         21  %    FL/AC:       23.3  %    20 - 24

 Est. FW:     524   gm      1 lb 2 oz     31  %     FW Discordancy        16   %
OB History

 Gravidity:     1
Gestational Age (Fetus A)

 LMP:            23w 4d       Date:  03/01/18                   EDD:  12/06/18
 U/S Today:      23w 1d                                         EDD:  12/09/18
 Best:           23w 4d    Det. By:  LMP  (03/01/18)            EDD:  12/06/18
Anatomy (Fetus A)

 Cranium:                Appears normal         LVOT:                   Previously seen
 Cavum:                  Appears normal         Aortic Arch:            Previously seen
 Ventricles:             Previously seen        Ductal Arch:            Previously seen
 Choroid Plexus:         Previously seen        Diaphragm:              Previously seen
 Cerebellum:             Previously seen        Stomach:                Appears normal, left
                                                                        sided
 Posterior Fossa:        Previously seen        Abdomen:                Appears normal
 Nuchal Fold:            Previously seen        Abdominal Wall:         Appears nml (cord
                                                                        insert, abd wall)
 Face:                   Appears normal         Cord Vessels:           Previously seen
                         (orbits and profile)
 Lips:                   Previously seen        Kidneys:                Appear normal
 Palate:                 Appears normal         Bladder:                Appears normal
 Thoracic:               Previously seen        Spine:                  Previously seen
 Heart:                  Appears normal         Upper Extremities:      Previously seen
                         (4CH, axis, and situs)
 RVOT:                   Previously seen        Lower Extremities:      Previously seen

Fetal Evaluation (Fetus B)

 Num Of Fetuses:          2
 Fetal Heart Rate(bpm):   151
 Cardiac Activity:        Observed
 Fetal Lie:               Upper Fetus,
 Presentation:            Variable
 Placenta:                Anterior
 P. Cord Insertion:       Previously Visualized
 Membrane Desc:       Dividing Membrane seen - Dichorionic.

 Amniotic Fluid
 AFI FV:      Within normal limits

                             Largest Pocket(cm)

Biometry (Fetus B)

 BPD:      57.9   mm     G. Age:  23w 5d         51  %    CI:         73.02  %    70 - 86
                                                          FL/HC:       20.7  %    18.7 -
 HC:      215.4   mm     G. Age:  23w 4d         35  %    HC/AC:       1.18       1.05 -
 AC:      182.8   mm     G. Age:  23w 1d         27  %    FL/BPD:      76.9  %    71 - 87
 FL:       44.5   mm     G. Age:  24w 5d         73  %    FL/AC:       24.3  %    20 - 24

 Est. FW:     627   gm      1 lb 6 oz     56  %     FW Discordancy     0 \ 16  %
Gestational Age (Fetus B)

 LMP:            23w 4d       Date:  03/01/18                   EDD:  12/06/18
 U/S Today:      23w 6d                                         EDD:  12/04/18
 Best:           23w 4d    Det. By:  LMP  (03/01/18)            EDD:  12/06/18
Anatomy (Fetus B)

 Cranium:                Appears normal         LVOT:                   Previously seen
 Cavum:                  Appears normal         Aortic Arch:            Previously seen
 Ventricles:             Previously seen        Ductal Arch:            Previously seen
 Choroid Plexus:         Previously seen        Diaphragm:              Previously seen
 Cerebellum:             Previously seen        Stomach:                Appears normal, left
                                                                        sided
 Posterior Fossa:        Previously seen        Abdomen:                Appears normal
 Nuchal Fold:            Previously seen        Abdominal Wall:         Appears nml (cord
                                                                        insert, abd wall)
 Face:                   Appears normal         Cord Vessels:           Previously seen
                         (orbits and profile)
 Lips:                   Previously seen        Kidneys:                Appear normal
 Palate:                 Appears normal         Bladder:                Appears normal
 Thoracic:               Previously seen        Spine:                  Previously seen
 Heart:                  Previously seen        Upper Extremities:      Previously seen
 RVOT:                   Previously seen        Lower Extremities:      Previously seen
Impression

 Dichorionic diamniotic twin gestation with appropriate interval
 growth and no significant discordancy.  Twin A with AC at the
 9th% Discordance 16%
Recommendations

 Scheduled repeat growth in 4 weeks.

## 2021-02-28 ENCOUNTER — Ambulatory Visit: Payer: Medicaid Other

## 2021-03-05 NOTE — Progress Notes (Signed)
Subjective:    Gabriella Ingram - 20 y.o. female MRN [id number]  Date of birth: 2001/04/22  HPI  Gabriella Ingram is to establish care.   Current issues and/or concerns:  Prefers to return for annual physical exam.   ANXIETY/DEPRESSION: Began after the birth of her twin boys in June 2020, she is not currently breastfeeding. Began counseling shortly after. Was told from that particular counselor that nothing is wrong with her. Subsequently began seeing a new counselor, Maylon Peppers, in March 2022 at a private practice and going well. Counseling sessions weekly. 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 Reports unsure if she would like to begin anxiety depression medication because of the side effects. Prefers to speak back with Maylon Peppers prior to beginning any medications. She is an involved mother and working a job  and does not a medication that will make her feel out of it. Her friend told her about a medication, Bupropion, and interested to know more about this. Prefers to take medication only as needed reporting she may feel well for 2 to 3 days and suddenly feeling anxious. Reports anxiety usually begins when she is alone. Reports she is the first on her biological maternal side with twins and felt that no one was able to relate to the challenge of having two babies at once.  Anxious mood: yes  Excessive worrying: yes Irritability: yes  Sweating: no Nausea: no Palpitations:no Hyperventilation: no Panic attacks: sometimes if driving and has to pull over  Depressed mood: yes Feelings of worthlessness: yes  Feelings of guilt: yes, wants to make sure doing best for twin boys Suicidal ideations, homicidal ideations, self-harm: Denies suicide and homicide today in office. Reports in the past considered suicide, this was shortly after twins were born. Plan at that time was to take medications that her mother, who is a breast cancer survivor, had in  the cabinet. Reports one of her twins woke up as she was about to take pills so she did not do it. Recent Stressors/Life Changes: yes   Relationship problems: Not currently with twins father. Reports sometimes they disagree which is stressful.   Family stress: yes    Financial stress: no    Job stress: no    Recent death/loss: no  2. BITE MARK: Reports some time ago one of her twins bit her on her forehead by mistake. Feels should have gotten sutures at that time but unable to do so. Not causing any trouble but would like to have removed if possible.   Depression screen [facility] 2/9 03/08/2021 12/24/2018 12/24/2018 11/11/2016  Decreased Interest 2 - 2 1  Down, Depressed, Hopeless 1 - 3 2  PHQ - 2 Score 3 - 5 3  Altered sleeping 0 - 0 -  Tired, decreased energy 3 - 3 -  Change in appetite 2 - 0 -  Feeling bad or failure about yourself  3 - 3 -  Trouble concentrating 2 - 0 -  Moving slowly or fidgety/restless 0 - 0 -  Suicidal thoughts 2 1 0 -  PHQ-9 Score 15 - 11 -      ROS per HPI    Health Maintenance:  Health Maintenance Due  Topic Date Due   Hepatitis C Screening  Never done   PAP-Cervical Cytology Screening  Never done   PAP SMEAR-Modifier  Never done    Past Medical History: Patient Active Problem List   Diagnosis Date Noted   Anxiety and  depression 03/08/2021   Preterm premature rupture of membranes (PPROM) with unknown onset of labor 11/08/2018   Leukocytes in urine 10/09/2018   Rash 09/03/2018   Supervision of other normal pregnancy, antepartum 07/24/2018   Chlamydia infection affecting pregnancy 07/24/2018   Dichorionic diamniotic twin gestation 07/02/2018    Social History   reports that she has never smoked. She has never used smokeless tobacco. She reports that she does not drink alcohol and does not use drugs.   Family History  family history includes Anxiety disorder in her mother; Arthritis in her maternal grandfather and maternal grandmother; Cancer in  her maternal grandfather and mother; Depression in her mother; Diabetes in her maternal grandfather; Hypertension in her maternal grandfather and maternal grandmother; Miscarriages / [place] in her mother.   Medications: reviewed and updated   Objective:   Physical Exam BP 129/74 (BP Location: Right Arm, Patient Position: Sitting, Cuff Size: Normal)   Pulse 65   Resp 16   Ht 5\' 6"  (1.676 m)   Wt 152 lb (68.9 kg)   LMP 02/15/2021   SpO2 98%   BMI 24.53 kg/m   Physical Exam HENT:     Head: Normocephalic and atraumatic.  Eyes:     Extraocular Movements: Extraocular movements intact.     Conjunctiva/sclera: Conjunctivae normal.     Pupils: Pupils are equal, round, and reactive to light.  Cardiovascular:     Rate and Rhythm: Normal rate and regular rhythm.     Pulses: Normal pulses.     Heart sounds: Normal heart sounds.  Pulmonary:     Effort: Pulmonary effort is normal.     Breath sounds: Normal breath sounds.  Musculoskeletal:     Cervical back: Normal range of motion and neck supple.  Skin:    Comments: Healed bite mark center of forehead, no evidence of compromised skin integrity and no drainage.  Neurological:     General: No focal deficit present.     Mental Status: She is alert and oriented to person, place, and time.  Psychiatric:        Mood and Affect: Mood normal.        Behavior: Behavior normal.      Assessment & Plan:  1. Encounter to establish care: - Patient presents today to establish care.  - Return for annual physical examination, labs, and health maintenance. Arrive fasting meaning having no food for at least 8 hours prior to appointment. You may have only water or black coffee. Please take scheduled medications as normal.  2. Anxiety and depression: - Patient denies thoughts of self-harm, suicidal ideations, homicidal ideations. - Patient declined pharmacological therapy. Patient prefers to speak back with counselor, 04/17/2021, prior to  beginning medication management. Counseled patient will need to make decision within 2 weeks or less if she would like to try medication, Hydroxyzine, if not patient will need new appointment. Patient agreeable. - Discussed with patient possible side effects of Hydroxyzine and antidepressants: Do not drink alcohol or use illicit substances with with this medication.  Avoid driving or hazardous activity until you know how this medication will affect you. Your reactions could be impaired. Dizziness or fainting can cause falls, accidents, or severe injuries. Common side effects include drowsiness, nausea, constipation, loss of appetite, dry mouth, increased sweating. Call your provider if you have pounding heartbeats or fluttering in your chest, a light-headed feeling like you may pass out, easy bruising/unusal bleeding, vision change, difficult or painful urination, impotence/sexual problems, liver problems (right-sided  upper stomach pain, itching, dark urine, yellowing of skin or eyes/jaundice, low levels of sodium in the body (headache, confusion, slurred speech, severe weakness, vomiting, loss of coordination, feeling unsteady), or manic episodes (racing thoughts, increased energy, decreased need for sleep, risk-taking behavior, being agitated, talkative) Seek medical attention immediately if you have symptoms of serotonin syndrome such as agitation, hallucinations, fever, sweating, shivering, fast heart rate, muscle stiffness, twitching, loss of coordination, nausea, vomiting, or diarrhea Report any new or worsening symptoms to your provider, such as but not limited to: mood or behavior changes, anxiety, panic attacks, trouble sleeping, or if you feel impulsive, irritable, agitated, hostile, aggressive, restless, hyperactive (mentally or physically), more depressed, or have thoughts about suicide or hurting yourself - Follow-up with primary provider as scheduled.   3. Scar: - Happened in th past from  one of her twin boys who bit her, and left scar on central forehead. - Per patient preference referral to Dermatology for further evaluation and management.  - Ambulatory referral to Dermatology    Patient was given clear instructions to go to Emergency Department or return to medical center if symptoms don't improve, worsen, or new problems develop.The patient verbalized understanding.  I discussed the assessment and treatment plan with the patient. The patient was provided an opportunity to ask questions and all were answered. The patient agreed with the plan and demonstrated an understanding of the instructions.   The patient was advised to call back or seek an in-person evaluation if the symptoms worsen or if the condition fails to improve as anticipated.    Ricky Stabs, NP 03/08/2021, 10:27 AM Primary Care at [facility]

## 2021-03-08 ENCOUNTER — Ambulatory Visit: Payer: Medicaid Other | Admitting: Family

## 2021-03-08 ENCOUNTER — Encounter: Payer: Self-pay | Admitting: Family

## 2021-03-08 ENCOUNTER — Other Ambulatory Visit: Payer: Self-pay

## 2021-03-08 VITALS — BP 129/74 | HR 65 | Resp 16 | Ht 66.0 in | Wt 152.0 lb

## 2021-03-08 DIAGNOSIS — Z13220 Encounter for screening for lipoid disorders: Secondary | ICD-10-CM

## 2021-03-08 DIAGNOSIS — L905 Scar conditions and fibrosis of skin: Secondary | ICD-10-CM

## 2021-03-08 DIAGNOSIS — F32A Depression, unspecified: Secondary | ICD-10-CM | POA: Insufficient documentation

## 2021-03-08 DIAGNOSIS — Z131 Encounter for screening for diabetes mellitus: Secondary | ICD-10-CM

## 2021-03-08 DIAGNOSIS — F419 Anxiety disorder, unspecified: Secondary | ICD-10-CM | POA: Diagnosis not present

## 2021-03-08 DIAGNOSIS — Z130 Encounter for screening for diseases of the blood and blood-forming organs and certain disorders involving the immune mechanism: Secondary | ICD-10-CM

## 2021-03-08 DIAGNOSIS — Z13228 Encounter for screening for other metabolic disorders: Secondary | ICD-10-CM

## 2021-03-08 DIAGNOSIS — Z124 Encounter for screening for malignant neoplasm of cervix: Secondary | ICD-10-CM

## 2021-03-08 DIAGNOSIS — Z1329 Encounter for screening for other suspected endocrine disorder: Secondary | ICD-10-CM

## 2021-03-08 DIAGNOSIS — Z1159 Encounter for screening for other viral diseases: Secondary | ICD-10-CM

## 2021-03-08 DIAGNOSIS — Z7689 Persons encountering health services in other specified circumstances: Secondary | ICD-10-CM | POA: Diagnosis not present

## 2021-03-08 DIAGNOSIS — Z0000 Encounter for general adult medical examination without abnormal findings: Secondary | ICD-10-CM

## 2021-03-08 DIAGNOSIS — Z113 Encounter for screening for infections with a predominantly sexual mode of transmission: Secondary | ICD-10-CM

## 2021-03-08 NOTE — Patient Instructions (Signed)
Preventive Care 21-22 Years Old, Female Preventive care refers to lifestyle choices and visits with your health care provider that can promote health and wellness. This includes: A yearly physical exam. This is also called an annual wellness visit. Regular dental and eye exams. Immunizations. Screening for certain conditions. Healthy lifestyle choices, such as: Eating a healthy diet. Getting regular exercise. Not using drugs or products that contain nicotine and tobacco. Limiting alcohol use. What can I expect for my preventive care visit? Physical exam Your health care provider may check your: Height and weight. These may be used to calculate your BMI (body mass index). BMI is a measurement that tells if you are at a healthy weight. Heart rate and blood pressure. Body temperature. Skin for abnormal spots. Counseling Your health care provider may ask you questions about your: Past medical problems. Family's medical history. Alcohol, tobacco, and drug use. Emotional well-being. Home life and relationship well-being. Sexual activity. Diet, exercise, and sleep habits. Work and work environment. Access to firearms. Method of birth control. Menstrual cycle. Pregnancy history. What immunizations do I need? Vaccines are usually given at various ages, according to a schedule. Your health care provider will recommend vaccines for you based on your age, medical history, and lifestyle or other factors, such as travel or where you work. What tests do I need? Blood tests Lipid and cholesterol levels. These may be checked every 5 years starting at age 20. Hepatitis C test. Hepatitis B test. Screening Diabetes screening. This is done by checking your blood sugar (glucose) after you have not eaten for a while (fasting). STD (sexually transmitted disease) testing, if you are at risk. BRCA-related cancer screening. This may be done if you have a family history of breast, ovarian, tubal, or  peritoneal cancers. Pelvic exam and Pap test. This may be done every 3 years starting at age 21. Starting at age 30, this may be done every 5 years if you have a Pap test in combination with an HPV test. Talk with your health care provider about your test results, treatment options, and if necessary, the need for more tests. Follow these instructions at home: Eating and drinking  Eat a healthy diet that includes fresh fruits and vegetables, whole grains, lean protein, and low-fat dairy products. Take vitamin and mineral supplements as recommended by your health care provider. Do not drink alcohol if: Your health care provider tells you not to drink. You are pregnant, may be pregnant, or are planning to become pregnant. If you drink alcohol: Limit how much you have to 0-1 drink a day. Be aware of how much alcohol is in your drink. In the [place] one drink equals one 12 oz bottle of beer (355 mL), one 5 oz glass of wine (148 mL), or one 1 oz glass of hard liquor (44 mL). Lifestyle Take daily care of your teeth and gums. Brush your teeth every morning and night with fluoride toothpaste. Floss one time each day. Stay active. Exercise for at least 30 minutes 5 or more days each week. Do not use any products that contain nicotine or tobacco, such as cigarettes, e-cigarettes, and chewing tobacco. If you need help quitting, ask your health care provider. Do not use drugs. If you are sexually active, practice safe sex. Use a condom or other form of protection to prevent STIs (sexually transmitted infections). If you do not wish to become pregnant, use a form of birth control. If you plan to become pregnant, see your health care provider   for a prepregnancy visit. Find healthy ways to cope with stress, such as: Meditation, yoga, or listening to music. Journaling. Talking to a trusted person. Spending time with friends and family. Safety Always wear your seat belt while driving or riding in a  vehicle. Do not drive: If you have been drinking alcohol. Do not ride with someone who has been drinking. When you are tired or distracted. While texting. Wear a helmet and other protective equipment during sports activities. If you have firearms in your house, make sure you follow all gun safety procedures. Seek help if you have been physically or sexually abused. What's next? Go to your health care provider once a year for an annual wellness visit. Ask your health care provider how often you should have your eyes and teeth checked. Stay up to date on all vaccines. This information is not intended to replace advice given to you by your health care provider. Make sure you discuss any questions you have with your health care provider. Document Revised: 07/07/2020 Document Reviewed: 01/08/2018 Elsevier Patient Education  2022 [employer]  

## 2021-03-08 NOTE — Progress Notes (Signed)
Concerns with anxiety

## 2021-04-06 NOTE — Progress Notes (Signed)
Erroneous encounter

## 2021-04-10 ENCOUNTER — Encounter: Payer: Medicaid Other | Admitting: Family

## 2021-04-10 DIAGNOSIS — Z13220 Encounter for screening for lipoid disorders: Secondary | ICD-10-CM

## 2021-04-10 DIAGNOSIS — Z131 Encounter for screening for diabetes mellitus: Secondary | ICD-10-CM

## 2021-04-10 DIAGNOSIS — Z130 Encounter for screening for diseases of the blood and blood-forming organs and certain disorders involving the immune mechanism: Secondary | ICD-10-CM

## 2021-04-10 DIAGNOSIS — Z1329 Encounter for screening for other suspected endocrine disorder: Secondary | ICD-10-CM

## 2021-04-10 DIAGNOSIS — Z113 Encounter for screening for infections with a predominantly sexual mode of transmission: Secondary | ICD-10-CM

## 2021-04-10 DIAGNOSIS — Z13228 Encounter for screening for other metabolic disorders: Secondary | ICD-10-CM

## 2021-04-10 DIAGNOSIS — Z124 Encounter for screening for malignant neoplasm of cervix: Secondary | ICD-10-CM

## 2021-04-10 DIAGNOSIS — Z0000 Encounter for general adult medical examination without abnormal findings: Secondary | ICD-10-CM

## 2021-04-10 DIAGNOSIS — Z1159 Encounter for screening for other viral diseases: Secondary | ICD-10-CM

## 2021-06-25 ENCOUNTER — Encounter: Payer: Self-pay | Admitting: Family

## 2021-06-25 ENCOUNTER — Other Ambulatory Visit: Payer: Self-pay

## 2021-06-25 ENCOUNTER — Ambulatory Visit: Payer: BC Managed Care – PPO | Admitting: Family

## 2021-06-25 VITALS — BP 137/80 | HR 82 | Temp 98.3°F | Resp 18 | Ht 65.98 in | Wt 154.0 lb

## 2021-06-25 DIAGNOSIS — G43909 Migraine, unspecified, not intractable, without status migrainosus: Secondary | ICD-10-CM

## 2021-06-25 MED ORDER — SUMATRIPTAN SUCCINATE 25 MG PO TABS: ORAL_TABLET | ORAL | 1 refills | Status: DC

## 2021-06-25 MED ORDER — NAPROXEN 500 MG PO TABS: 500.0000 mg | ORAL_TABLET | Freq: Two times a day (BID) | ORAL | 1 refills | Status: DC

## 2021-06-25 NOTE — Progress Notes (Signed)
Pt presents for migraine headaches states it been going on since having her kids but more frequent, pt complains of light sensitivity and blurred vision when migraine is present, pt states she grinds teeth at night not sure if this is contributing to her headaches, tylenol and naproxen has given relief

## 2021-06-25 NOTE — Progress Notes (Deleted)
Patient ID: Gabriella Ingram, female    DOB: [birth year]  MRN: [id number]  CC: No chief complaint on file.   Subjective: Gabriella Ingram is a 21 y.o. female who presents for Her concerns today include: ***  URINARY SYMPTOMS Breastfeeding?    Patient Active Problem List   Diagnosis Date Noted   Anxiety and depression 03/08/2021   Preterm premature rupture of membranes (PPROM) with unknown onset of labor 11/08/2018   Leukocytes in urine 10/09/2018   Rash 09/03/2018   Supervision of other normal pregnancy, antepartum 07/24/2018   Chlamydia infection affecting pregnancy 07/24/2018   Dichorionic diamniotic twin gestation 07/02/2018     Current Outpatient Medications on File Prior to Visit  Medication Sig Dispense Refill   acetaminophen (TYLENOL) 325 MG tablet Take 2 tablets (650 mg total) by mouth every 4 (four) hours as needed (for pain scale < 4). (Patient not taking: Reported on 03/08/2021)     aspirin EC 81 MG tablet Take 1 tablet (81 mg total) by mouth daily. Take after 12 weeks for prevention of preeclampsia later in pregnancy (Patient not taking: Reported on 03/08/2021) 300 tablet 2   ibuprofen (ADVIL) 600 MG tablet Take 1 tablet (600 mg total) by mouth every 6 (six) hours. (Patient not taking: Reported on 03/08/2021) 30 tablet 0   norgestimate-ethinyl estradiol (ORTHO-CYCLEN) 0.25-35 MG-MCG tablet Take 1 tablet by mouth daily. (Patient not taking: Reported on 03/08/2021) 3 Package 3   Prenat-FeAsp-Meth-FA-DHA w/o A (PRENATE PIXIE) 10-0.6-0.4-200 MG CAPS Take 1 capsule by mouth daily. (Patient not taking: Reported on 03/08/2021) 30 capsule 11   No current facility-administered medications on file prior to visit.    Allergies  Allergen Reactions   Other     Food allergy -tomatoes- rash, hives, SOB Dove soap- rash    Social History   Socioeconomic History   Marital status: Single    Spouse name: Not on file   Number of children: Not on file   Years of education: Not on  file   Highest education level: Not on file  Occupational History   Occupation: unemployed  Tobacco Use   Smoking status: Never   Smokeless tobacco: Never  Vaping Use   Vaping Use: Never used  Substance and Sexual Activity   Alcohol use: No   Drug use: No   Sexual activity: Not Currently    Birth control/protection: None  Other Topics Concern   Not on file  Social History Narrative   Not on file   Social Determinants of Health   Financial Resource Strain: Not on file  Food Insecurity: Not on file  Transportation Needs: Not on file  Physical Activity: Not on file  Stress: Not on file  Social Connections: Not on file  Intimate Partner Violence: Not on file    Family History  Problem Relation Age of Onset   Anxiety disorder Mother    Depression Mother    Cancer Mother    Miscarriages / [place] Mother    Arthritis Maternal Grandmother    Hypertension Maternal Grandmother    Arthritis Maternal Grandfather    Diabetes Maternal Grandfather    Cancer Maternal Grandfather    Hypertension Maternal Grandfather     Past Surgical History:  Procedure Laterality Date   NO PAST SURGERIES      ROS: Review of Systems Negative except as stated above  PHYSICAL EXAM: There were no vitals taken for this visit.  Physical Exam  {female adult master:310786} {female adult master:310785}  CMP Latest  Ref Rng & Units 09/03/2018 07/02/2018 04/02/2017  Glucose 65 - 99 mg/dL 70 72 91  BUN 6 - 20 mg/dL 6 4(L) 8  Creatinine 0.57 - 1.00 mg/dL 0.55(L) 0.64 0.66  Sodium 134 - 144 mmol/L 140 141 139  Potassium 3.5 - 5.2 mmol/L 4.1 4.7 4.1  Chloride 96 - 106 mmol/L 105 105 107  CO2 20 - 29 mmol/L 19(L) 20 26  Calcium 8.7 - 10.2 mg/dL 9.0 9.3 9.3  Total Protein 6.0 - 8.5 g/dL 6.5 7.0 6.7  Total Bilirubin 0.0 - 1.2 mg/dL 0.3 0.3 0.5  Alkaline Phos 39 - 117 IU/L 76 50 -  AST 0 - 40 IU/L 16 22 13   ALT 0 - 32 IU/L 10 11 8    Lipid Panel  No results found for: CHOL, TRIG, HDL, CHOLHDL,  VLDL, LDLCALC, LDLDIRECT  CBC    Component Value Date/Time   WBC 13.4 (H) 11/09/2018 0548   RBC 3.96 11/09/2018 0548   HGB 11.8 (L) 11/09/2018 0548   HGB 12.6 09/09/2018 0911   HCT 34.5 (L) 11/09/2018 0548   HCT 37.0 09/09/2018 0911   PLT 92 (L) 11/09/2018 0548   PLT 109 (L) 09/09/2018 0911   MCV 87.1 11/09/2018 0548   MCV 88 09/09/2018 0911   MCH 29.8 11/09/2018 0548   MCHC 34.2 11/09/2018 0548   RDW 12.8 11/09/2018 0548   RDW 13.0 09/09/2018 0911   LYMPHSABS 1.6 07/02/2018 1525   EOSABS 0.0 07/02/2018 1525   BASOSABS 0.0 07/02/2018 1525    ASSESSMENT AND PLAN:  There are no diagnoses linked to this encounter.   Patient was given the opportunity to ask questions.  Patient verbalized understanding of the plan and was able to repeat key elements of the plan. Patient was given clear instructions to go to Emergency Department or return to medical center if symptoms don't improve, worsen, or new problems develop.The patient verbalized understanding.   No orders of the defined types were placed in this encounter.    Requested Prescriptions    No prescriptions requested or ordered in this encounter    No follow-ups on file.  Camillia Herter, NP

## 2021-06-25 NOTE — Progress Notes (Signed)
Patient ID: Harveen Lala, female    DOB: 02/13/2001  MRN: [id number]  CC: Migraines   Subjective: Gabriella Ingram is a 21 y.o. female who presents for migraines.   Her concerns today include:  MIGRAINES: Duration: years Severity: 9/10 Quality: pressure, banging in the head Frequency: daily  Location: before period begins usually left front of head, when awakening in the morning located back of head (may be related to grinding teeth at night she does wear dental guards but chews through them). Headache duration: 30 minutes to 1.5 hours  Radiation: no Alleviating factors: Naproxen Aggravating factors: light, sounds, endorses blurry vision  Treatments attempted: Treatments attempted: Naproxen, Tylenol Nausea:  no Vomiting: no Photophobia:  yes Phonophobia:  yes Confusion:  no Gait disturbance/ataxia:  no Behavioral changes:  no Fevers:  no  Patient Active Problem List   Diagnosis Date Noted   Anxiety and depression 03/08/2021   Preterm premature rupture of membranes (PPROM) with unknown onset of labor 11/08/2018   Leukocytes in urine 10/09/2018   Rash 09/03/2018   Supervision of other normal pregnancy, antepartum 07/24/2018   Chlamydia infection affecting pregnancy 07/24/2018   Dichorionic diamniotic twin gestation 07/02/2018     Current Outpatient Medications on File Prior to Visit  Medication Sig Dispense Refill   acetaminophen (TYLENOL) 325 MG tablet Take 650 mg by mouth every 6 (six) hours as needed.     ibuprofen (ADVIL) 600 MG tablet Take 1 tablet (600 mg total) by mouth every 6 (six) hours. (Patient not taking: Reported on 03/08/2021) 30 tablet 0   No current facility-administered medications on file prior to visit.    Allergies  Allergen Reactions   Other     Food allergy -tomatoes- rash, hives, SOB Dove soap- rash   Tomato Itching    Social History   Socioeconomic History   Marital status: Single    Spouse name: Not on file   Number of children: Not  on file   Years of education: Not on file   Highest education level: Not on file  Occupational History   Occupation: unemployed  Tobacco Use   Smoking status: Never    Passive exposure: Never   Smokeless tobacco: Never  Vaping Use   Vaping Use: Never used  Substance and Sexual Activity   Alcohol use: No   Drug use: No   Sexual activity: Not Currently    Birth control/protection: None  Other Topics Concern   Not on file  Social History Narrative   Not on file   Social Determinants of Health   Financial Resource Strain: Not on file  Food Insecurity: Not on file  Transportation Needs: Not on file  Physical Activity: Not on file  Stress: Not on file  Social Connections: Not on file  Intimate Partner Violence: Not on file    Family History  Problem Relation Age of Onset   Anxiety disorder Mother    Depression Mother    Cancer Mother    Miscarriages / [place] Mother    Arthritis Maternal Grandmother    Hypertension Maternal Grandmother    Arthritis Maternal Grandfather    Diabetes Maternal Grandfather    Cancer Maternal Grandfather    Hypertension Maternal Grandfather     Past Surgical History:  Procedure Laterality Date   NO PAST SURGERIES      ROS: Review of Systems Negative except as stated above  PHYSICAL EXAM: BP 137/80 (BP Location: Left Arm, Patient Position: Sitting, Cuff Size: Normal)  Pulse 82    Temp 98.3 F (36.8 C)    Resp 18    Ht 5' 5.98" (1.676 m)    Wt 154 lb (69.9 kg)    SpO2 98%    BMI 24.87 kg/m   Physical Exam HENT:     Head: Normocephalic and atraumatic.     Right Ear: Tympanic membrane, ear canal and external ear normal.     Left Ear: Tympanic membrane, ear canal and external ear normal.  Eyes:     Extraocular Movements: Extraocular movements intact.     Conjunctiva/sclera: Conjunctivae normal.     Pupils: Pupils are equal, round, and reactive to light.  Cardiovascular:     Rate and Rhythm: Normal rate and regular rhythm.      Pulses: Normal pulses.     Heart sounds: Normal heart sounds.  Pulmonary:     Effort: Pulmonary effort is normal.     Breath sounds: Normal breath sounds.  Musculoskeletal:     Cervical back: Normal range of motion and neck supple.  Neurological:     General: No focal deficit present.     Mental Status: She is alert and oriented to person, place, and time.  Psychiatric:        Mood and Affect: Mood normal.        Behavior: Behavior normal.   ASSESSMENT AND PLAN: 1. Migraine without status migrainosus, not intractable, unspecified migraine type: - Sumatriptan as prescribed. Counseled on medication compliance and adverse effects.  - Naproxen as prescribed.  Counseled on medication compliance and adverse effects.  - Referral to Neurology for further evaluation and management. - Follow-up with primary provider as scheduled.  - SUMAtriptan (IMITREX) 25 MG tablet; Take 25 mg (1 tablet total) by mouth at the start of the headache. May repeat in 2 hours x 1 if headache persists. Max of 2 tablets/24 hours.  Dispense: 30 tablet; Refill: 1 - Ambulatory referral to Neurology - naproxen (NAPROSYN) 500 MG tablet; Take 1 tablet (500 mg total) by mouth 2 (two) times daily with a meal.  Dispense: 30 tablet; Refill: 1   Patient was given the opportunity to ask questions.  Patient verbalized understanding of the plan and was able to repeat key elements of the plan. Patient was given clear instructions to go to Emergency Department or return to medical center if symptoms don't improve, worsen, or new problems develop.The patient verbalized understanding.   Orders Placed This Encounter  Procedures   Ambulatory referral to Neurology     Requested Prescriptions   Signed Prescriptions Disp Refills   SUMAtriptan (IMITREX) 25 MG tablet 30 tablet 1    Sig: Take 25 mg (1 tablet total) by mouth at the start of the headache. May repeat in 2 hours x 1 if headache persists. Max of 2 tablets/24 hours.    naproxen (NAPROSYN) 500 MG tablet 30 tablet 1    Sig: Take 1 tablet (500 mg total) by mouth 2 (two) times daily with a meal.    Follow-up with primary provider as scheduled.   Camillia Herter, NP

## 2021-06-26 ENCOUNTER — Ambulatory Visit: Payer: Medicaid Other | Admitting: Family

## 2021-06-26 ENCOUNTER — Encounter: Payer: Self-pay | Admitting: Family

## 2021-06-26 ENCOUNTER — Encounter: Payer: Self-pay | Admitting: Neurology

## 2021-06-27 ENCOUNTER — Ambulatory Visit: Payer: BC Managed Care – PPO | Admitting: Family

## 2021-06-27 ENCOUNTER — Other Ambulatory Visit (HOSPITAL_COMMUNITY)
Admission: RE | Admit: 2021-06-27 | Discharge: 2021-06-27 | Disposition: A | Discharge status: Home / Self Care | Payer: BLUE CROSS/BLUE SHIELD | Source: Ambulatory Visit | Attending: Family | Admitting: Family

## 2021-06-27 ENCOUNTER — Encounter: Payer: Self-pay | Admitting: Family

## 2021-06-27 ENCOUNTER — Other Ambulatory Visit: Payer: Self-pay

## 2021-06-27 VITALS — BP 139/86 | HR 77 | Temp 98.3°F | Resp 18 | Ht 65.98 in | Wt 154.0 lb

## 2021-06-27 DIAGNOSIS — Z3202 Encounter for pregnancy test, result negative: Secondary | ICD-10-CM

## 2021-06-27 DIAGNOSIS — B3731 Acute candidiasis of vulva and vagina: Secondary | ICD-10-CM | POA: Diagnosis not present

## 2021-06-27 DIAGNOSIS — Z113 Encounter for screening for infections with a predominantly sexual mode of transmission: Secondary | ICD-10-CM | POA: Insufficient documentation

## 2021-06-27 LAB — POCT URINALYSIS DIP (CLINITEK)
Bilirubin, UA: NEGATIVE
Blood, UA: NEGATIVE
Glucose, UA: NEGATIVE mg/dL
Ketones, POC UA: NEGATIVE mg/dL
Nitrite, UA: NEGATIVE
POC PROTEIN,UA: NEGATIVE
Spec Grav, UA: 1.01 (ref 1.010–1.025)
Urobilinogen, UA: 0.2 E.U./dL
pH, UA: 7 (ref 5.0–8.0)

## 2021-06-27 LAB — POCT URINE PREGNANCY: Preg Test, Ur: NEGATIVE

## 2021-06-27 NOTE — Progress Notes (Signed)
Pt presents for STD testing, states that due to infidelity within her current relationship

## 2021-06-27 NOTE — Patient Instructions (Signed)
Safe Sex Practicing safe sex means taking steps before and during sex to reduce your risk of: Getting an STI (sexually transmitted infection). Giving your partner an STI. Unwanted or unplanned pregnancy. How to practice safe sex Ways you can practice safe sex  Limit your sexual partners to only one partner who is having sex with only you. Avoid using alcohol and drugs before having sex. Alcohol and drugs can affect your judgment. Before having sex with a new partner: Talk to your partner about past partners, past STIs, and drug use. Get screened for STIs and discuss the results with your partner. Ask your partner to get screened too. Check your body regularly for sores, blisters, rashes, or unusual discharge. If you notice any of these problems, visit your health care provider. Avoid sexual contact if you have symptoms of an infection or you are being treated for an STI. While having sex, use a condom. Make sure to: Use a condom every time you have vaginal, oral, or anal sex. Both females and males should wear condoms during oral sex. Keep condoms in place from the beginning to the end of sexual activity. Use a latex condom, if possible. Latex condoms offer the best protection. Use only water-based lubricants with a condom. Using petroleum-based lubricants or oils will weaken the condom and increase the chance that it will break. Ways your health care provider can help you practice safe sex  See your health care provider for regular screenings, exams, and tests for STIs. Talk with your health care provider about what kind of birth control (contraception) is best for you. Get vaccinated against hepatitis B and human papillomavirus (HPV). If you are at risk of being infected with HIV (human immunodeficiency virus), talk with your health care provider about taking a prescription medicine to prevent HIV infection. You are at risk for HIV if you: Are a man who has sex with other men. Are  sexually active with more than one partner. Take drugs by injection. Have a sex partner who has HIV. Have unprotected sex. Have sex with someone who has sex with both men and women. Have had an STI. Follow these instructions at home: Take over-the-counter and prescription medicines only as told by your health care provider. Keep all follow-up visits. This is important. Where to find more information Centers for Disease Control and Prevention: www.cdc.gov Planned Parenthood: www.plannedparenthood.org Office on Women's Health: www.womenshealth.gov Summary Practicing safe sex means taking steps before and during sex to reduce your risk getting an STI, giving your partner an STI, and having an unwanted or unplanned pregnancy. Before having sex with a new partner, talk to your partner about past partners, past STIs, and drug use. Use a condom every time you have vaginal, oral, or anal sex. Both females and males should wear condoms during oral sex. Check your body regularly for sores, blisters, rashes, or unusual discharge. If you notice any of these problems, visit your health care provider. See your health care provider for regular screenings, exams, and tests for STIs. This information is not intended to replace advice given to you by your health care provider. Make sure you discuss any questions you have with your health care provider. Document Revised: 10/04/2019 Document Reviewed: 10/04/2019 Elsevier Patient Education  2022 Elsevier Inc.  

## 2021-06-27 NOTE — Progress Notes (Signed)
Patient ID: Gabriella Ingram, female    DOB: 08-20-2000  MRN: [id number]  CC: STD Testing  Subjective: Gabriella Ingram is a 21 y.o. female who presents for STD testing.   Her concerns today include:  Reports recently made aware that her boyfriend had intercourse with another woman. Her and her boyfriend do use protection, condoms. Denies any symptoms. Would like screening for reassurance.   Previously had urinary tract infection and prescribed Nitrofurantoin which symptoms improved overall.   Patient Active Problem List   Diagnosis Date Noted   Anxiety and depression 03/08/2021   Preterm premature rupture of membranes (PPROM) with unknown onset of labor 11/08/2018   Leukocytes in urine 10/09/2018   Rash 09/03/2018   Supervision of other normal pregnancy, antepartum 07/24/2018   Chlamydia infection affecting pregnancy 07/24/2018   Dichorionic diamniotic twin gestation 07/02/2018     Current Outpatient Medications on File Prior to Visit  Medication Sig Dispense Refill   acetaminophen (TYLENOL) 325 MG tablet Take 650 mg by mouth every 6 (six) hours as needed.     ibuprofen (ADVIL) 600 MG tablet Take 1 tablet (600 mg total) by mouth every 6 (six) hours. (Patient not taking: Reported on 03/08/2021) 30 tablet 0   naproxen (NAPROSYN) 500 MG tablet Take 1 tablet (500 mg total) by mouth 2 (two) times daily with a meal. 30 tablet 1   nitrofurantoin, macrocrystal-monohydrate, (MACROBID) 100 MG capsule Macrobid 100 mg capsule  Take 1 capsule every 12 hours by oral route for 10 days.     SUMAtriptan (IMITREX) 25 MG tablet Take 25 mg (1 tablet total) by mouth at the start of the headache. May repeat in 2 hours x 1 if headache persists. Max of 2 tablets/24 hours. 30 tablet 1   No current facility-administered medications on file prior to visit.    Allergies  Allergen Reactions   Other     Food allergy -tomatoes- rash, hives, SOB Dove soap- rash   Tomato Itching    Social History    Socioeconomic History   Marital status: Single    Spouse name: Not on file   Number of children: Not on file   Years of education: Not on file   Highest education level: Not on file  Occupational History   Occupation: unemployed  Tobacco Use   Smoking status: Never    Passive exposure: Never   Smokeless tobacco: Never  Vaping Use   Vaping Use: Never used  Substance and Sexual Activity   Alcohol use: No   Drug use: No   Sexual activity: Not Currently    Birth control/protection: None  Other Topics Concern   Not on file  Social History Narrative   Not on file   Social Determinants of Health   Financial Resource Strain: Not on file  Food Insecurity: Not on file  Transportation Needs: Not on file  Physical Activity: Not on file  Stress: Not on file  Social Connections: Not on file  Intimate Partner Violence: Not on file    Family History  Problem Relation Age of Onset   Anxiety disorder Mother    Depression Mother    Cancer Mother    Miscarriages / [place] Mother    Arthritis Maternal Grandmother    Hypertension Maternal Grandmother    Arthritis Maternal Grandfather    Diabetes Maternal Grandfather    Cancer Maternal Grandfather    Hypertension Maternal Grandfather     Past Surgical History:  Procedure Laterality Date   NO  PAST SURGERIES      ROS: Review of Systems Negative except as stated above  PHYSICAL EXAM: BP 139/86 (BP Location: Left Arm, Patient Position: Sitting, Cuff Size: Normal)    Pulse 77    Temp 98.3 F (36.8 C)    Resp 18    Ht 5' 5.98" (1.676 m)    Wt 154 lb (69.9 kg)    SpO2 99%    BMI 24.87 kg/m   Physical Exam HENT:     Head: Normocephalic.  Eyes:     Extraocular Movements: Extraocular movements intact.     Conjunctiva/sclera: Conjunctivae normal.     Pupils: Pupils are equal, round, and reactive to light.  Cardiovascular:     Rate and Rhythm: Normal rate and regular rhythm.     Pulses: Normal pulses.     Heart sounds:  Normal heart sounds.  Pulmonary:     Effort: Pulmonary effort is normal.     Breath sounds: Normal breath sounds.  Musculoskeletal:     Cervical back: Normal range of motion and neck supple.  Neurological:     General: No focal deficit present.     Mental Status: She is alert and oriented to person, place, and time.  Psychiatric:        Mood and Affect: Mood normal.        Behavior: Behavior normal.   Results for orders placed or performed in visit on 06/27/21  POCT URINALYSIS DIP (CLINITEK)  Result Value Ref Range   Color, UA yellow yellow   Clarity, UA clear clear   Glucose, UA negative negative mg/dL   Bilirubin, UA negative negative   Ketones, POC UA negative negative mg/dL   Spec Grav, UA 5.621 3.086 - 1.025   Blood, UA negative negative   pH, UA 7.0 5.0 - 8.0   POC PROTEIN,UA negative negative, trace   Urobilinogen, UA 0.2 0.2 or 1.0 E.U./dL   Nitrite, UA Negative Negative   Leukocytes, UA Trace (A) Negative  POCT urine pregnancy  Result Value Ref Range   Preg Test, Ur Negative Negative     ASSESSMENT AND PLAN: 1. Routine screening for STI (sexually transmitted infection): - No urinary tract infection.  - Urine pregnancy negative.  - Cervicovaginal self-swab to screen for chlamydia, gonorrhea, trichomonas, bacterial vaginitis, and candida vaginitis. - Cervicovaginal ancillary only - POCT URINALYSIS DIP (CLINITEK) - POCT urine pregnancy   Patient was given the opportunity to ask questions.  Patient verbalized understanding of the plan and was able to repeat key elements of the plan. Patient was given clear instructions to go to Emergency Department or return to medical center if symptoms don't improve, worsen, or new problems develop.The patient verbalized understanding.   Orders Placed This Encounter  Procedures   POCT URINALYSIS DIP (CLINITEK)   POCT urine pregnancy    Follow-up with primary provider as scheduled.  Rema Fendt, NP

## 2021-06-27 NOTE — Progress Notes (Signed)
No urinary tract infection.   Urine pregnancy negative.

## 2021-06-28 ENCOUNTER — Other Ambulatory Visit: Payer: Self-pay | Admitting: Family

## 2021-06-28 DIAGNOSIS — B3731 Acute candidiasis of vulva and vagina: Secondary | ICD-10-CM

## 2021-06-28 DIAGNOSIS — N760 Acute vaginitis: Secondary | ICD-10-CM

## 2021-06-28 DIAGNOSIS — B9689 Other specified bacterial agents as the cause of diseases classified elsewhere: Secondary | ICD-10-CM | POA: Insufficient documentation

## 2021-06-28 LAB — CERVICOVAGINAL ANCILLARY ONLY
Bacterial Vaginitis (gardnerella): POSITIVE — AB
Candida Glabrata: NEGATIVE
Candida Vaginitis: POSITIVE — AB
Chlamydia: NEGATIVE
Comment: NEGATIVE
Comment: NEGATIVE
Comment: NEGATIVE
Comment: NEGATIVE
Comment: NEGATIVE
Comment: NORMAL
Neisseria Gonorrhea: NEGATIVE
Trichomonas: NEGATIVE

## 2021-06-28 MED ORDER — FLUCONAZOLE 150 MG PO TABS: 150.0000 mg | ORAL_TABLET | Freq: Once | ORAL | 0 refills | Status: AC

## 2021-06-28 MED ORDER — METRONIDAZOLE 500 MG PO TABS: 500.0000 mg | ORAL_TABLET | Freq: Two times a day (BID) | ORAL | 0 refills | Status: AC

## 2021-06-28 NOTE — Progress Notes (Signed)
Gonorrhea, Chlamydia, and Trichomonas negative.   Positive for Bacterial Vaginitis, an overgrowth of normal bacteria in the vagina due to changes in pH. Prescribed Metronidazole (Flagyl) twice per day for 7 days. Do not drink alcohol while taking this medication as this may cause severe nausea, vomiting, and upset stomach.   Positive for Candida Vaginitis which is better known as a yeast infection. Prescribed Fluconazole 150 mg (1 tablet) for a one time dose.

## 2021-06-30 ENCOUNTER — Telehealth: Payer: Self-pay | Admitting: *Deleted

## 2021-06-30 NOTE — Telephone Encounter (Signed)
Pharmacy said they did not receive recent refills.  Please sen to [facility] at [address].  Please return call.

## 2021-07-02 ENCOUNTER — Telehealth: Payer: Self-pay | Admitting: Family

## 2021-07-02 NOTE — Telephone Encounter (Signed)
Per pharmacy at [facility], medications have been transferred.

## 2021-07-02 NOTE — Telephone Encounter (Signed)
Pt asked for recent Medication to please be sent to [facility] on [address]- Thank you.

## 2021-07-02 NOTE — Telephone Encounter (Signed)
Per pharmacy at [facility] [place], medications have been transferred.  °

## 2021-07-03 NOTE — Telephone Encounter (Signed)
Pt stated [facility] transferred prescription to correct location

## 2021-07-24 NOTE — Progress Notes (Deleted)
? ?NEUROLOGY CONSULTATION NOTE ? ?[name] ?MRN: [id number] ?DOB: 2001/02/03 ? ?Referring provider: Ricky Stabs, NP ?Primary care provider: Ricky Stabs, NP ? ?Reason for consult:  headache ? ?Assessment/Plan:  ? ?*** ? ? ?Subjective:  ?Gabriella Ingram is a 21 year old female *** who presents for headaches.  History supplemented by referring provider's note. ? ?Onset:  Since having her children ***.  More frequent *** ?Location:  left frontal region just prior to menses, back of head when awakens in morning ?Quality:  pressure, pounding ?Intensity:  9/10.   ?Aura:  absent ?Prodrome:  absent ?Associated symptoms:  photophobia, phonophobia, blurred vision.  She denies associated nausea, vomiting, unilateral numbness or weakness. ?Duration:  30 minutes ?Frequency:  daily ?Frequency of abortive medication: *** ?Triggers:  bright light, menses.  May be related to teeth grinding.  Has dental guard but chews through it. ?Relieving factors:  naproxen ?Activity:  *** ? ?Rescue protocol:  *** ?Current NSAIDS/analgesics:  acetaminophen, naproxen 500mg  ?Current triptans:  sumatriptan 25mg  ?Current ergotamine:  none ?Current anti-emetic:  none ?Current muscle relaxants:  none ?Current Antihypertensive medications:  none ?Current Antidepressant medications:  none ?Current Anticonvulsant medications:  none ?Current anti-CGRP:  none ?Current Vitamins/Herbal/Supplements:  none ?Current Antihistamines/Decongestants:  none ?Other therapy:  *** ?Hormone/birth control:  none ? ?Past NSAIDS/analgesics:  ibuprofen, ASA ?Past abortive triptans:  none ?Past abortive ergotamine:  none ?Past muscle relaxants:  none ?Past anti-emetic:  none ?Past antihypertensive medications:  none ?Past antidepressant medications:  none ?Past anticonvulsant medications:  none ?Past anti-CGRP:  none ?Past vitamins/Herbal/Supplements:  none ?Past antihistamines/decongestants:  none ?Other past therapies:  *** ? ?Caffeine:  *** ?Alcohol:  *** ?Smoker:   *** ?Diet:  *** ?Exercise:  *** ?Depression:  ***; Anxiety:  *** ?Other pain:  *** ?Sleep hygiene:  *** ?Family history of headache:  *** ? ?  ? ? ?PAST MEDICAL HISTORY: ?Past Medical History:  ?Diagnosis Date  ? Chlamydia   ? this pregnancy  ? Decreased platelet count (HCC) 04/07/2017  ? ? ?PAST SURGICAL HISTORY: ?Past Surgical History:  ?Procedure Laterality Date  ? NO PAST SURGERIES    ? ? ?MEDICATIONS: ?Current Outpatient Medications on File Prior to Visit  ?Medication Sig Dispense Refill  ? acetaminophen (TYLENOL) 325 MG tablet Take 650 mg by mouth every 6 (six) hours as needed.    ? ibuprofen (ADVIL) 600 MG tablet Take 1 tablet (600 mg total) by mouth every 6 (six) hours. (Patient not taking: Reported on 03/08/2021) 30 tablet 0  ? naproxen (NAPROSYN) 500 MG tablet Take 1 tablet (500 mg total) by mouth 2 (two) times daily with a meal. 30 tablet 1  ? nitrofurantoin, macrocrystal-monohydrate, (MACROBID) 100 MG capsule Macrobid 100 mg capsule ? Take 1 capsule every 12 hours by oral route for 10 days.    ? SUMAtriptan (IMITREX) 25 MG tablet Take 25 mg (1 tablet total) by mouth at the start of the headache. May repeat in 2 hours x 1 if headache persists. Max of 2 tablets/24 hours. 30 tablet 1  ? ?No current facility-administered medications on file prior to visit.  ? ? ?ALLERGIES: ?Allergies  ?Allergen Reactions  ? Other   ?  Food allergy -tomatoes- rash, hives, SOB ?Dove soap- rash  ? Tomato Itching  ? ? ?FAMILY HISTORY: ?Family History  ?Problem Relation Age of Onset  ? Anxiety disorder Mother   ? Depression Mother   ? Cancer Mother   ? Miscarriages / 04/09/2017 Mother   ? Arthritis Maternal Grandmother   ?  Hypertension Maternal Grandmother   ? Arthritis Maternal Grandfather   ? Diabetes Maternal Grandfather   ? Cancer Maternal Grandfather   ? Hypertension Maternal Grandfather   ? ? ?Objective:  ?*** ?General: No acute distress.  Patient appears well-groomed.   ?Head:  Normocephalic/atraumatic ?Eyes:  fundi  examined but not visualized ?Neck: supple, no paraspinal tenderness, full range of motion ?Back: No paraspinal tenderness ?Heart: regular rate and rhythm ?Lungs: Clear to auscultation bilaterally. ?Vascular: No carotid bruits. ?Neurological Exam: ?Mental status: alert and oriented to person, place, and time, recent and remote memory intact, fund of knowledge intact, attention and concentration intact, speech fluent and not dysarthric, language intact. ?Cranial nerves: ?CN I: not tested ?CN II: pupils equal, round and reactive to light, visual fields intact ?CN III, IV, VI:  full range of motion, no nystagmus, no ptosis ?CN V: facial sensation intact. ?CN VII: upper and lower face symmetric ?CN VIII: hearing intact ?CN IX, X: gag intact, uvula midline ?CN XI: sternocleidomastoid and trapezius muscles intact ?CN XII: tongue midline ?Bulk & Tone: normal, no fasciculations. ?Motor:  muscle strength 5/5 throughout ?Sensation:  Pinprick, temperature and vibratory sensation intact. ?Deep Tendon Reflexes:  2+ throughout,  toes downgoing.   ?Finger to nose testing:  Without dysmetria.   ?Heel to shin:  Without dysmetria.   ?Gait:  Normal station and stride.  Romberg negative. ? ? ? ?Thank you for allowing me to take part in the care of this patient. ? ?Shon Millet, DO ? ?CC: Ricky Stabs, NP ? ? ? ? ?

## 2021-07-25 ENCOUNTER — Ambulatory Visit: Payer: Medicaid Other | Admitting: Neurology

## 2021-09-25 ENCOUNTER — Other Ambulatory Visit: Payer: Self-pay

## 2021-09-25 ENCOUNTER — Ambulatory Visit (INDEPENDENT_AMBULATORY_CARE_PROVIDER_SITE_OTHER): Payer: BC Managed Care – PPO

## 2021-09-25 ENCOUNTER — Encounter: Payer: Self-pay | Admitting: Emergency Medicine

## 2021-09-25 ENCOUNTER — Ambulatory Visit
Admission: EM | Admit: 2021-09-25 | Discharge: 2021-09-25 | Disposition: A | Discharge status: Home / Self Care | Payer: BC Managed Care – PPO | Attending: Physician Assistant | Admitting: Physician Assistant

## 2021-09-25 ENCOUNTER — Ambulatory Visit: Payer: BC Managed Care – PPO

## 2021-09-25 DIAGNOSIS — M25532 Pain in left wrist: Secondary | ICD-10-CM | POA: Diagnosis not present

## 2021-09-25 DIAGNOSIS — G43909 Migraine, unspecified, not intractable, without status migrainosus: Secondary | ICD-10-CM

## 2021-09-25 DIAGNOSIS — S63502A Unspecified sprain of left wrist, initial encounter: Secondary | ICD-10-CM

## 2021-09-25 MED ORDER — NAPROXEN 500 MG PO TABS: 500.0000 mg | ORAL_TABLET | Freq: Two times a day (BID) | ORAL | 0 refills | Status: DC

## 2021-09-25 NOTE — ED Provider Notes (Signed)
?[facility] ? ? ? ?CSN: [id number] ?Arrival date & time: 09/25/21  0806 ? ? ?  ? ?History   ?Chief Complaint ?Chief Complaint  ?Patient presents with  ? Wrist Pain  ? ? ?HPI ?Gabriella Ingram is a 21 y.o. female.  ? ?Patient presents with for evaluation of left wrist pain.  Reports that child slammed a car door which caused her left wrist.  She had ongoing pain since that time which is rated 8 on a 0-10 pain scale described as aching with periodic shooting pains, worse with certain movements or palpation, no alleviating factors identified.  Recent injury or surgery involving her hand/wrist.  She is right-handed.  Denies any numbness or paresthesias of the fingers.  She has not tried any over-the-counter medication for symptom management.  She is having difficulty with daily activities as result of symptoms.  She is confident she is not pregnant.  She is not actively breast-feeding. ? ? ?Past Medical History:  ?Diagnosis Date  ? Chlamydia   ? this pregnancy  ? Decreased platelet count ([place]) 04/07/2017  ? ? ?Patient Active Problem List  ? Diagnosis Date Noted  ? Bacterial vaginitis 06/28/2021  ? Candida vaginitis 06/28/2021  ? Anxiety and depression 03/08/2021  ? Preterm premature rupture of membranes (PPROM) with unknown onset of labor 11/08/2018  ? Leukocytes in urine 10/09/2018  ? Rash 09/03/2018  ? Supervision of other normal pregnancy, antepartum 07/24/2018  ? Chlamydia infection affecting pregnancy 07/24/2018  ? Dichorionic diamniotic twin gestation 07/02/2018  ? ? ?Past Surgical History:  ?Procedure Laterality Date  ? NO PAST SURGERIES    ? ? ?OB History   ? ? Gravida  ?1  ? Para  ?1  ? Term  ?   ? Preterm  ?1  ? AB  ?   ? Living  ?2  ?  ? ? SAB  ?   ? IAB  ?   ? Ectopic  ?   ? Multiple  ?1  ? Live Births  ?2  ?   ?  ?  ? ? ? ?Home Medications   ? ?Prior to Admission medications   ?Medication Sig Start Date End Date Taking? Authorizing Provider  ?acetaminophen (TYLENOL) 325 MG tablet Take 650 mg by  mouth every 6 (six) hours as needed.    [provider]  ?naproxen (NAPROSYN) 500 MG tablet Take 1 tablet (500 mg total) by mouth 2 (two) times daily with a meal. 09/25/21   Afrah Burlison, Derry Skill, PA-C  ?SUMAtriptan (IMITREX) 25 MG tablet Take 25 mg (1 tablet total) by mouth at the start of the headache. May repeat in 2 hours x 1 if headache persists. Max of 2 tablets/24 hours. 06/25/21   Camillia Herter, NP  ? ? ?Family History ?Family History  ?Problem Relation Age of Onset  ? Anxiety disorder Mother   ? Depression Mother   ? Cancer Mother   ? Miscarriages / [place] Mother   ? Arthritis Maternal Grandmother   ? Hypertension Maternal Grandmother   ? Arthritis Maternal Grandfather   ? Diabetes Maternal Grandfather   ? Cancer Maternal Grandfather   ? Hypertension Maternal Grandfather   ? ? ?Social History ?Social History  ? ?Tobacco Use  ? Smoking status: Never  ?  Passive exposure: Never  ? Smokeless tobacco: Never  ?Vaping Use  ? Vaping Use: Never used  ?Substance Use Topics  ? Alcohol use: No  ? Drug use: No  ? ? ? ?Allergies   ?  Other and Tomato ? ? ?Review of Systems ?Review of Systems  ?Constitutional:  Positive for activity change. Negative for appetite change, fatigue and fever.  ?Musculoskeletal:  Positive for arthralgias and joint swelling. Negative for myalgias.  ?Skin:  Negative for color change and wound.  ?Neurological:  Negative for dizziness, weakness, light-headedness, numbness and headaches.  ? ? ?Physical Exam ?Triage Vital Signs ?ED Triage Vitals  ?Enc Vitals Group  ?   BP 09/25/21 0830 129/81  ?   Pulse Rate 09/25/21 0830 78  ?   Resp 09/25/21 0830 18  ?   Temp 09/25/21 0830 98.1 ?F (36.7 ?C)  ?   Temp Source 09/25/21 0830 Oral  ?   SpO2 09/25/21 0830 95 %  ?   Weight --   ?   Height --   ?   Head Circumference --   ?   Peak Flow --   ?   Pain Score 09/25/21 0831 6  ?   Pain Loc --   ?   Pain Edu? --   ?   Excl. in GC? --   ? ?No data found. ? ?Updated Vital Signs ?BP 129/81 (BP Location: Left  Arm)   Pulse 78   Temp 98.1 ?F (36.7 ?C) (Oral)   Resp 18   SpO2 95%  ? ?Visual Acuity ?Right Eye Distance:   ?Left Eye Distance:   ?Bilateral Distance:   ? ?Right Eye Near:   ?Left Eye Near:    ?Bilateral Near:    ? ?Physical Exam ?Vitals reviewed.  ?Constitutional:   ?   General: She is awake. She is not in acute distress. ?   Appearance: Normal appearance. She is well-developed. She is not ill-appearing.  ?   Comments: Very pleasant female stated age in no acute distress sitting in exam room  ?HENT:  ?   Head: Normocephalic and atraumatic.  ?Cardiovascular:  ?   Rate and Rhythm: Normal rate and regular rhythm.  ?   Pulses:     ?     Radial pulses are 2+ on the right side and 2+ on the left side.  ?   Heart sounds: Normal heart sounds, S1 normal and S2 normal. No murmur heard. ?   Comments: Capillary refill within 2 seconds fingers ?Pulmonary:  ?   Effort: Pulmonary effort is normal.  ?   Breath sounds: Normal breath sounds. No wheezing, rhonchi or rales.  ?   Comments: Clear to auscultation bilaterally ?Musculoskeletal:  ?   Left wrist: Tenderness and bony tenderness present. No swelling, deformity or snuff box tenderness. Decreased range of motion.  ?   Comments: Left wrist/hand: Hand neurovascularly intact.  Decreased range of motion with flexion, extension, ulnar deviation, radial deviation secondary to pain.  No deformity.  Tenderness to palpation over the radial wrist.  Normal pincer and grip strength.  ?Psychiatric:     ?   Behavior: Behavior is cooperative.  ? ? ? ?UC Treatments / Results  ?Labs ?(all labs ordered are listed, but only abnormal results are displayed) ?Labs Reviewed - No data to display ? ?EKG ? ? ?Radiology ?DG Wrist Complete Left ? ?Result Date: 09/25/2021 ?CLINICAL DATA:  Injury, wrist pain EXAM: LEFT WRIST - COMPLETE 3+ VIEW COMPARISON:  None Available. FINDINGS: There is no evidence of fracture or dislocation. There is no evidence of arthropathy or other focal bone abnormality. Soft  tissues are unremarkable. IMPRESSION: Negative. Electronically Signed   By: Duanne GuessNicholas  Plundo D.O.  On: 09/25/2021 09:01   ? ?Procedures ?Procedures (including critical care time) ? ?Medications Ordered in UC ?Medications - No data to display ? ?Initial Impression / Assessment and Plan / UC Course  ?I have reviewed the triage vital signs and the nursing notes. ? ?Pertinent labs & imaging results that were available during my care of the patient were reviewed by me and considered in my medical decision making (see chart for details). ? ?  ? ?X-ray obtained given mechanism of injury showed no osseous abnormality.  Suspect contusion as etiology of symptoms.  Will use conservative treatment measures including RICE protocol for symptom relief.  Patient was placed in brace for comfort and support.  We will start Naprosyn for pain relief.  She was instructed not to take additional NSAIDs including aspirin, ibuprofen/Advil, naproxen/Aleve with this medication as it can cause stomach bleeding.  Can use Tylenol for breakthrough pain.  If symptoms do not improving quickly she is to follow-up with orthopedics and was given contact information for local provider with instruction to call and schedule an appointment.  Discussed that if she has any worsening symptoms she is to be seen immediately.  Strict return precautions given.  Work excuse note provided. ? ?Final Clinical Impressions(s) / UC Diagnoses  ? ?Final diagnoses:  ?Left wrist pain  ?Sprain of left wrist, initial encounter  ? ? ? ?Discharge Instructions   ? ?  ?Your x-ray did not show any fracture.  I believe you have a contusion versus sprain causing your symptoms.  Please use the brace for comfort and support.  Keep your wrist elevated and use ice for additional symptom relief.  Take Naprosyn for pain.  You should not take additional NSAIDs including aspirin, ibuprofen/Advil, naproxen/Aleve with this medication as it can cause stomach bleeding.  You can use Tylenol  for breakthrough pain.  If your symptoms or not improving quickly please call to schedule appointment orthopedics as we discussed.  If anything worsens please return for reevaluation as we discussed. ? ? ? ? ?

## 2021-09-25 NOTE — Discharge Instructions (Addendum)
Your x-ray did not show any fracture.  I believe you have a contusion versus sprain causing your symptoms.  Please use the brace for comfort and support.  Keep your wrist elevated and use ice for additional symptom relief.  Take Naprosyn for pain.  You should not take additional NSAIDs including aspirin, ibuprofen/Advil, naproxen/Aleve with this medication as it can cause stomach bleeding.  You can use Tylenol for breakthrough pain.  If your symptoms or not improving quickly please call to schedule appointment orthopedics as we discussed.  If anything worsens please return for reevaluation as we discussed. ?

## 2021-09-25 NOTE — ED Triage Notes (Signed)
Pt here for left wrist pain after wrist was slammed in car door yesterday ?

## 2022-05-14 ENCOUNTER — Encounter: Payer: Self-pay | Admitting: Family

## 2022-05-24 ENCOUNTER — Telehealth: Payer: 59 | Admitting: Family Medicine

## 2022-05-24 DIAGNOSIS — N760 Acute vaginitis: Secondary | ICD-10-CM | POA: Diagnosis not present

## 2022-05-24 DIAGNOSIS — B9689 Other specified bacterial agents as the cause of diseases classified elsewhere: Secondary | ICD-10-CM

## 2022-05-24 MED ORDER — METRONIDAZOLE 500 MG PO TABS: 500.0000 mg | ORAL_TABLET | Freq: Two times a day (BID) | ORAL | 0 refills | Status: DC

## 2022-05-24 MED ORDER — METRONIDAZOLE 500 MG PO TABS: 500.0000 mg | ORAL_TABLET | Freq: Two times a day (BID) | ORAL | 0 refills | Status: AC

## 2022-05-24 NOTE — Patient Instructions (Signed)

## 2022-05-24 NOTE — Progress Notes (Signed)
Virtual Visit Consent   Gabriella Ingram, you are scheduled for a virtual visit with a [facility] provider today. Just as with appointments in the office, your consent must be obtained to participate. Your consent will be active for this visit and any virtual visit you may have with one of our providers in the next 365 days. If you have a MyChart account, a copy of this consent can be sent to you electronically.  As this is a virtual visit, video technology does not allow for your provider to perform a traditional examination. This may limit your provider's ability to fully assess your condition. If your provider identifies any concerns that need to be evaluated in person or the need to arrange testing (such as labs, EKG, etc.), we will make arrangements to do so. Although advances in technology are sophisticated, we cannot ensure that it will always work on either your end or our end. If the connection with a video visit is poor, the visit may have to be switched to a telephone visit. With either a video or telephone visit, we are not always able to ensure that we have a secure connection.  By engaging in this virtual visit, you consent to the provision of healthcare and authorize for your insurance to be billed (if applicable) for the services provided during this visit. Depending on your insurance coverage, you may receive a charge related to this service.  I need to obtain your verbal consent now. Are you willing to proceed with your visit today? Gabriella Ingram has provided verbal consent on 05/24/2022 for a virtual visit (video or telephone). Dellia Nims, FNP  Date: 05/24/2022 9:18 AM  Virtual Visit via Video Note   I, Dellia Nims, connected with  Gabriella Ingram  ([id number], [birth year]) on 05/24/22 at  9:15 AM EST by a video-enabled telemedicine application and verified that I am speaking with the correct person using two identifiers.  Location: Patient:  Provider: Virtual Visit Location Provider:  Home Office   I discussed the limitations of evaluation and management by telemedicine and the availability of in person appointments. The patient expressed understanding and agreed to proceed.    History of Present Illness: Gabriella Ingram is a 22 y.o. who identifies as a female who was assigned female at birth, and is being seen today for Odor to thin clear vag discharge. Pt report an abortion 6 weeks ago and was released from that. Denies abd pain and fever. Marland Kitchen  HPI: HPI  Problems:  Patient Active Problem List   Diagnosis Date Noted   Bacterial vaginitis 06/28/2021   Candida vaginitis 06/28/2021   Anxiety and depression 03/08/2021   Preterm premature rupture of membranes (PPROM) with unknown onset of labor 11/08/2018   Leukocytes in urine 10/09/2018   Rash 09/03/2018   Supervision of other normal pregnancy, antepartum 07/24/2018   Chlamydia infection affecting pregnancy 07/24/2018   Dichorionic diamniotic twin gestation 07/02/2018    Allergies:  Allergies  Allergen Reactions   Other     Food allergy -tomatoes- rash, hives, SOB Dove soap- rash   Tomato Itching   Medications:  Current Outpatient Medications:    acetaminophen (TYLENOL) 325 MG tablet, Take 650 mg by mouth every 6 (six) hours as needed., Disp: , Rfl:    naproxen (NAPROSYN) 500 MG tablet, Take 1 tablet (500 mg total) by mouth 2 (two) times daily with a meal., Disp: 30 tablet, Rfl: 0   SUMAtriptan (IMITREX) 25 MG tablet, Take 25 mg (1 tablet total) by  mouth at the start of the headache. May repeat in 2 hours x 1 if headache persists. Max of 2 tablets/24 hours., Disp: 30 tablet, Rfl: 1  Observations/Objective: Patient is well-developed, well-nourished in no acute distress.  Resting comfortably  at home.  Head is normocephalic, atraumatic.  No labored breathing.  Speech is clear and coherent with logical content.  Patient is alert and oriented at baseline.    Assessment and Plan: 1. Bacterial vaginitis  Use  condoms, continue birth control, follow up with GYN or PCP if sx persist or worsen.   Follow Up Instructions: I discussed the assessment and treatment plan with the patient. The patient was provided an opportunity to ask questions and all were answered. The patient agreed with the plan and demonstrated an understanding of the instructions.  A copy of instructions were sent to the patient via MyChart unless otherwise noted below.     The patient was advised to call back or seek an in-person evaluation if the symptoms worsen or if the condition fails to improve as anticipated.  Time:  I spent 10  minutes with the patient via telehealth technology discussing the above problems/concerns.    Dellia Nims, FNP

## 2022-05-29 ENCOUNTER — Telehealth: Payer: 59 | Admitting: Physician Assistant

## 2022-05-29 DIAGNOSIS — T7840XA Allergy, unspecified, initial encounter: Secondary | ICD-10-CM

## 2022-05-29 MED ORDER — HYDROXYZINE HCL 10 MG PO TABS: 10.0000 mg | ORAL_TABLET | Freq: Three times a day (TID) | ORAL | 0 refills | Status: DC | PRN

## 2022-05-29 NOTE — Patient Instructions (Signed)
  Gabriella Ingram, thank you for joining Leeanne Rio, PA-C for today's virtual visit.  While this provider is not your primary care provider (PCP), if your PCP is located in our provider database this encounter information will be shared with them immediately following your visit.   [name] account gives you access to today's visit and all your visits, tests, and labs performed at [facility] " click here if you don't have a [facility] account or go to [email or web address]  Consent: (Patient) Gabriella Ingram provided verbal consent for this virtual visit at the beginning of the encounter.  Current Medications:  Current Outpatient Medications:    hydrOXYzine (ATARAX) 10 MG tablet, Take 1 tablet (10 mg total) by mouth 3 (three) times daily as needed., Disp: 30 tablet, Rfl: 0   acetaminophen (TYLENOL) 325 MG tablet, Take 650 mg by mouth every 6 (six) hours as needed., Disp: , Rfl:    metroNIDAZOLE (FLAGYL) 500 MG tablet, Take 1 tablet (500 mg total) by mouth 2 (two) times daily for 7 days., Disp: 14 tablet, Rfl: 0   naproxen (NAPROSYN) 500 MG tablet, Take 1 tablet (500 mg total) by mouth 2 (two) times daily with a meal., Disp: 30 tablet, Rfl: 0   SUMAtriptan (IMITREX) 25 MG tablet, Take 25 mg (1 tablet total) by mouth at the start of the headache. May repeat in 2 hours x 1 if headache persists. Max of 2 tablets/24 hours., Disp: 30 tablet, Rfl: 1   Medications ordered in this encounter:  Meds ordered this encounter  Medications   hydrOXYzine (ATARAX) 10 MG tablet    Sig: Take 1 tablet (10 mg total) by mouth 3 (three) times daily as needed.    Dispense:  30 tablet    Refill:  0    Order Specific Question:   Supervising Provider    Answer:   Chase Picket [id number]     *If you need refills on other medications prior to your next appointment, please contact your pharmacy*  Follow-Up: Call back or seek an in-person evaluation if the symptoms worsen  or if the condition fails to improve as anticipated.  [facility] [phone]  Other Instructions Please keep hands washed. Avoid rubbing the eyes. You can use a warm compress to help promote any drainage. Ok to use OTC allergy eye drops. Use the Hydroxyzine as directed for next 24-36 hours to help things fully resolve.  Any new or worsening symptoms -- want you to seek an in-person evaluation.   If you have been instructed to have an in-person evaluation today at a local Urgent Care facility, please use the link below. It will take you to a list of all of our available [facility], including address, phone number and hours of operation. Please do not delay care.  [facility]  If you or a family member do not have a primary care provider, use the link below to schedule a visit and establish care. When you choose a [facility] primary care physician or advanced practice provider, you gain a long-term partner in health. Find a Primary Care Provider  Learn more about [facility]'s in-office and virtual care options: [facility]

## 2022-05-29 NOTE — Progress Notes (Signed)
Virtual Visit Consent   Gabriella Ingram, you are scheduled for a virtual visit with a [facility] provider today. Just as with appointments in the office, your consent must be obtained to participate. Your consent will be active for this visit and any virtual visit you may have with one of our providers in the next 365 days. If you have a MyChart account, a copy of this consent can be sent to you electronically.  As this is a virtual visit, video technology does not allow for your provider to perform a traditional examination. This may limit your provider's ability to fully assess your condition. If your provider identifies any concerns that need to be evaluated in person or the need to arrange testing (such as labs, EKG, etc.), we will make arrangements to do so. Although advances in technology are sophisticated, we cannot ensure that it will always work on either your end or our end. If the connection with a video visit is poor, the visit may have to be switched to a telephone visit. With either a video or telephone visit, we are not always able to ensure that we have a secure connection.  By engaging in this virtual visit, you consent to the provision of healthcare and authorize for your insurance to be billed (if applicable) for the services provided during this visit. Depending on your insurance coverage, you may receive a charge related to this service.  I need to obtain your verbal consent now. Are you willing to proceed with your visit today? Gabriella Ingram has provided verbal consent on 05/29/2022 for a virtual visit (video or telephone). Leeanne Rio, [place]  Date: 05/29/2022 9:17 AM  Virtual Visit via Video Note   I, Leeanne Rio, connected with  Gabriella Ingram  ([id number], 10/28/2000) on 05/29/22 at  9:15 AM EST by a video-enabled telemedicine application and verified that I am speaking with the correct person using two identifiers.  Location: Patient: Virtual Visit Location Patient:  Home Provider: Virtual Visit Location Provider: Home Office   I discussed the limitations of evaluation and management by telemedicine and the availability of in person appointments. The patient expressed understanding and agreed to proceed.    History of Present Illness: Gabriella Ingram is a 22 y.o. who identifies as a female who was assigned female at birth, and is being seen today for possible allergic reaction. Notes last night into this morning with itching on the right side of her face with some upper eyelid swelling and itching. Denies rash, mouth/tongue swelling, racing heart or SOB. Was at hair salon yesterday and does not know is something got in her eye, etc. Denies eye pain or vision changes. Took a benadryl this AM with improvement in swelling. Still having some remnant itching.   HPI: HPI  Problems:  Patient Active Problem List   Diagnosis Date Noted   Bacterial vaginitis 06/28/2021   Candida vaginitis 06/28/2021   Anxiety and depression 03/08/2021   Preterm premature rupture of membranes (PPROM) with unknown onset of labor 11/08/2018   Leukocytes in urine 10/09/2018   Rash 09/03/2018   Supervision of other normal pregnancy, antepartum 07/24/2018   Chlamydia infection affecting pregnancy 07/24/2018   Dichorionic diamniotic twin gestation 07/02/2018    Allergies:  Allergies  Allergen Reactions   Other     Food allergy -tomatoes- rash, hives, SOB Dove soap- rash   Tomato Itching   Medications:  Current Outpatient Medications:    hydrOXYzine (ATARAX) 10 MG tablet, Take 1 tablet (10  mg total) by mouth 3 (three) times daily as needed., Disp: 30 tablet, Rfl: 0   acetaminophen (TYLENOL) 325 MG tablet, Take 650 mg by mouth every 6 (six) hours as needed., Disp: , Rfl:    metroNIDAZOLE (FLAGYL) 500 MG tablet, Take 1 tablet (500 mg total) by mouth 2 (two) times daily for 7 days., Disp: 14 tablet, Rfl: 0   naproxen (NAPROSYN) 500 MG tablet, Take 1 tablet (500 mg total) by mouth 2  (two) times daily with a meal., Disp: 30 tablet, Rfl: 0   SUMAtriptan (IMITREX) 25 MG tablet, Take 25 mg (1 tablet total) by mouth at the start of the headache. May repeat in 2 hours x 1 if headache persists. Max of 2 tablets/24 hours., Disp: 30 tablet, Rfl: 1  Observations/Objective: Patient is well-developed, well-nourished in no acute distress.  Resting comfortably at home.  Head is normocephalic, atraumatic.  No labored breathing. Speech is clear and coherent with logical content.  Patient is alert and oriented at baseline.  Right upper eye lid swelling without noted lesion. No conjunctival injection noted. No lower lid swelling noted. L eye within normal limits. Pupils are equal and round. EOMI.  Assessment and Plan: 1. Allergic reaction, initial encounter - hydrOXYzine (ATARAX) 10 MG tablet; Take 1 tablet (10 mg total) by mouth 3 (three) times daily as needed.  Dispense: 30 tablet; Refill: 0  Mild allergic reaction, improved with Benadryl. Supportive measures including compresses discussed. Will give Rx lower dose Hydroxyzine to further help reduce reaction symptoms giving the Benadryl is making her very sleepy.   Follow Up Instructions: I discussed the assessment and treatment plan with the patient. The patient was provided an opportunity to ask questions and all were answered. The patient agreed with the plan and demonstrated an understanding of the instructions.  A copy of instructions were sent to the patient via MyChart unless otherwise noted below.   The patient was advised to call back or seek an in-person evaluation if the symptoms worsen or if the condition fails to improve as anticipated.  Time:  I spent 10 minutes with the patient via telehealth technology discussing the above problems/concerns.    Leeanne Rio, PA-C

## 2022-07-11 ENCOUNTER — Ambulatory Visit
Admission: EM | Admit: 2022-07-11 | Discharge: 2022-07-11 | Disposition: A | Discharge status: Home / Self Care | Payer: 59 | Attending: Nurse Practitioner | Admitting: Nurse Practitioner

## 2022-07-11 DIAGNOSIS — H6121 Impacted cerumen, right ear: Secondary | ICD-10-CM

## 2022-07-11 DIAGNOSIS — H6991 Unspecified Eustachian tube disorder, right ear: Secondary | ICD-10-CM

## 2022-07-11 DIAGNOSIS — H66002 Acute suppurative otitis media without spontaneous rupture of ear drum, left ear: Secondary | ICD-10-CM

## 2022-07-11 DIAGNOSIS — J0100 Acute maxillary sinusitis, unspecified: Secondary | ICD-10-CM | POA: Diagnosis not present

## 2022-07-11 DIAGNOSIS — H9201 Otalgia, right ear: Secondary | ICD-10-CM

## 2022-07-11 MED ORDER — FLUCONAZOLE 150 MG PO TABS: 150.0000 mg | ORAL_TABLET | Freq: Every day | ORAL | 0 refills | Status: DC

## 2022-07-11 MED ORDER — FLUTICASONE PROPIONATE 50 MCG/ACT NA SUSP: 1.0000 | Freq: Every day | NASAL | 0 refills | Status: DC

## 2022-07-11 MED ORDER — AMOXICILLIN 875 MG PO TABS: 875.0000 mg | ORAL_TABLET | Freq: Two times a day (BID) | ORAL | 0 refills | Status: AC

## 2022-07-11 NOTE — Discharge Instructions (Addendum)
Amoxil Twice daily for 10 days Flonase daily Diflucan as needed for antibiotic induced yeast infection Nasal rinses as tolerated Follow-up with your PCP if symptoms do not improve ER for any worsening symptoms

## 2022-07-11 NOTE — ED Provider Notes (Signed)
[facility]    CSN: [id number] Arrival date & time: 07/11/22  1223      History   Chief Complaint Chief Complaint  Patient presents with   Headache   Ear Pain    HPI Moranda Rigoni is a 22 y.o. female presents for ear pain and sinus pressure for 3 days.  Patient reports sinus pressure/pain with headache, right ear pain, nasal drainage.  Denies fevers, chills, cough, sore throat, body aches, nausea/vomit/diarrhea, breath.  No asthma or smoking history.  She is been taking Naprosyn, Alka-Seltzer, and Mucinex for symptoms without improvement.  No other concerns at this time.   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    Patient Active Problem List   Diagnosis Date Noted   Bacterial vaginitis 06/28/2021   Candida vaginitis 06/28/2021   Anxiety and depression 03/08/2021   Preterm premature rupture of membranes (PPROM) with unknown onset of labor 11/08/2018   Leukocytes in urine 10/09/2018   Rash 09/03/2018   Supervision of other normal pregnancy, antepartum 07/24/2018   Chlamydia infection affecting pregnancy 07/24/2018   Dichorionic diamniotic twin gestation 07/02/2018    Past Surgical History:  Procedure Laterality Date   NO PAST SURGERIES      OB History     Gravida  1   Para  1   Term      Preterm  1   AB      Living  2      SAB      IAB      Ectopic      Multiple  1   Live Births  2            Home Medications    Prior to Admission medications   Medication Sig Start Date End Date Taking? Authorizing Provider  amoxicillin (AMOXIL) 875 MG tablet Take 1 tablet (875 mg total) by mouth 2 (two) times daily for 10 days. 07/11/22 07/21/22 Yes Melynda Ripple, NP  fluconazole (DIFLUCAN) 150 MG tablet Take 1 tablet (150 mg total) by mouth daily. 07/11/22  Yes Melynda Ripple, NP  fluticasone  (FLONASE) 50 MCG/ACT nasal spray Place 1 spray into both nostrils daily. 07/11/22  Yes Melynda Ripple, NP  acetaminophen (TYLENOL) 325 MG tablet Take 650 mg by mouth every 6 (six) hours as needed.    [provider]  hydrOXYzine (ATARAX) 10 MG tablet Take 1 tablet (10 mg total) by mouth 3 (three) times daily as needed. 05/29/22   Brunetta Jeans, PA-C  naproxen (NAPROSYN) 500 MG tablet Take 1 tablet (500 mg total) by mouth 2 (two) times daily with a meal. 09/25/21   Raspet, Erin K, PA-C  SUMAtriptan (IMITREX) 25 MG tablet Take 25 mg (1 tablet total) by mouth at the start of the headache. May repeat in 2 hours x 1 if headache persists. Max of 2 tablets/24 hours. 06/25/21   Camillia Herter, NP    Family History Family History  Problem Relation Age of Onset   Anxiety disorder Mother    Depression Mother    Cancer Mother    Miscarriages / [place] Mother    Arthritis Maternal Grandmother    Hypertension Maternal Grandmother    Arthritis Maternal Grandfather    Diabetes Maternal Grandfather    Cancer Maternal Grandfather    Hypertension Maternal  Grandfather     Social History Social History   Tobacco Use   Smoking status: Never    Passive exposure: Never   Smokeless tobacco: Never  Vaping Use   Vaping Use: Never used  Substance Use Topics   Alcohol use: No   Drug use: No     Allergies   Other and Tomato   Review of Systems Review of Systems  HENT:  Positive for congestion, ear pain, sinus pressure and sinus pain.   Neurological:  Positive for headaches.     Physical Exam Triage Vital Signs ED Triage Vitals [07/11/22 1239]  Enc Vitals Group     BP 126/80     Pulse Rate 75     Resp 16     Temp 98.1 F (36.7 C)     Temp Source Oral     SpO2 98 %     Weight      Height      Head Circumference      Peak Flow      Pain Score 7     Pain Loc      Pain Edu?      Excl. in [place]?    No data found.  Updated Vital Signs BP 126/80 (BP Location: Right Arm)    Pulse 75   Temp 98.1 F (36.7 C) (Oral)   Resp 16   LMP 06/21/2022   SpO2 98%   Visual Acuity Right Eye Distance:   Left Eye Distance:   Bilateral Distance:    Right Eye Near:   Left Eye Near:    Bilateral Near:     Physical Exam Vitals and nursing note reviewed.  Constitutional:      General: She is not in acute distress.    Appearance: She is well-developed. She is not ill-appearing.  HENT:     Head: Normocephalic and atraumatic.     Right Ear: Tympanic membrane and ear canal normal. There is impacted cerumen.     Left Ear: Ear canal normal. Tympanic membrane is erythematous.     Ears:     Comments: After irrigation right TM within normal limits    Nose: Congestion present.     Right Sinus: Maxillary sinus tenderness present.     Left Sinus: Maxillary sinus tenderness present.     Mouth/Throat:     Mouth: Mucous membranes are moist.     Pharynx: Oropharynx is clear. Uvula midline. No oropharyngeal exudate or posterior oropharyngeal erythema.     Tonsils: No tonsillar exudate or tonsillar abscesses.  Eyes:     Conjunctiva/sclera: Conjunctivae normal.     Pupils: Pupils are equal, round, and reactive to light.  Cardiovascular:     Rate and Rhythm: Normal rate and regular rhythm.     Heart sounds: Normal heart sounds.  Pulmonary:     Effort: Pulmonary effort is normal.     Breath sounds: Normal breath sounds.  Musculoskeletal:     Cervical back: Normal range of motion and neck supple.  Lymphadenopathy:     Cervical: No cervical adenopathy.  Skin:    General: Skin is warm and dry.  Neurological:     General: No focal deficit present.     Mental Status: She is alert and oriented to person, place, and time.  Psychiatric:        Mood and Affect: Mood normal.        Behavior: Behavior normal.      UC Treatments / Results  Labs (  all labs ordered are listed, but only abnormal results are displayed) Labs Reviewed - No data to display  EKG   Radiology No  results found.  Procedures Procedures (including critical care time)  Medications Ordered in UC Medications - No data to display  Initial Impression / Assessment and Plan / UC Course  I have reviewed the triage vital signs and the nursing notes.  Pertinent labs & imaging results that were available during my care of the patient were reviewed by me and considered in my medical decision making (see chart for details).     Reviewed exam and symptoms with patient.  No red flags on exam Discussed eustachian tube dysfunction of right ear, start Flonase Discussed left otitis media, start amoxicillin Nasal rinses as tolerated Rest and fluids PCP follow-up if symptoms do not improve ER precautions reviewed and patient verbalized understanding Final Clinical Impressions(s) / UC Diagnoses   Final diagnoses:  Right ear pain  Impacted cerumen of right ear  Acute maxillary sinusitis, recurrence not specified  Acute suppurative otitis media of left ear without spontaneous rupture of tympanic membrane, recurrence not specified  Eustachian tube dysfunction, right     Discharge Instructions      Amoxil Twice daily for 10 days Flonase daily Diflucan as needed for antibiotic induced yeast infection Nasal rinses as tolerated Follow-up with your PCP if symptoms do not improve ER for any worsening symptoms     ED Prescriptions     Medication Sig Dispense Auth. Provider   amoxicillin (AMOXIL) 875 MG tablet Take 1 tablet (875 mg total) by mouth 2 (two) times daily for 10 days. 20 tablet Melynda Ripple, NP   fluticasone (FLONASE) 50 MCG/ACT nasal spray Place 1 spray into both nostrils daily. 15.8 mL Melynda Ripple, NP   fluconazole (DIFLUCAN) 150 MG tablet Take 1 tablet (150 mg total) by mouth daily. 2 tablet Melynda Ripple, NP      PDMP not reviewed this encounter.   Melynda Ripple, NP 07/11/22 1330

## 2022-07-11 NOTE — ED Triage Notes (Signed)
Pt c/o sinus headache, right ear discomfort x3 days.   Home interventions: naprosyn, alka seltzer, mucinex

## 2022-10-14 NOTE — Progress Notes (Unsigned)
Erroneous encounter-disregard

## 2022-10-15 ENCOUNTER — Encounter: Payer: BLUE CROSS/BLUE SHIELD | Admitting: Family

## 2022-10-15 DIAGNOSIS — Z130 Encounter for screening for diseases of the blood and blood-forming organs and certain disorders involving the immune mechanism: Secondary | ICD-10-CM

## 2022-10-15 DIAGNOSIS — Z113 Encounter for screening for infections with a predominantly sexual mode of transmission: Secondary | ICD-10-CM

## 2022-10-15 DIAGNOSIS — Z13228 Encounter for screening for other metabolic disorders: Secondary | ICD-10-CM

## 2022-10-15 DIAGNOSIS — Z13220 Encounter for screening for lipoid disorders: Secondary | ICD-10-CM

## 2022-10-15 DIAGNOSIS — Z1159 Encounter for screening for other viral diseases: Secondary | ICD-10-CM

## 2022-10-15 DIAGNOSIS — Z124 Encounter for screening for malignant neoplasm of cervix: Secondary | ICD-10-CM

## 2022-10-15 DIAGNOSIS — Z0000 Encounter for general adult medical examination without abnormal findings: Secondary | ICD-10-CM

## 2022-10-15 DIAGNOSIS — Z1329 Encounter for screening for other suspected endocrine disorder: Secondary | ICD-10-CM

## 2022-10-15 DIAGNOSIS — Z131 Encounter for screening for diabetes mellitus: Secondary | ICD-10-CM

## 2023-04-28 ENCOUNTER — Ambulatory Visit: Payer: Self-pay | Admitting: *Deleted

## 2023-04-28 NOTE — Telephone Encounter (Signed)
Summary: vaginal discharge, odor   Vaginal discharge, no pain, strong odor,  wanted to speak with nurse first     Chief Complaint: Vaginal discharge Symptoms: Vaginal discharge, thin, clear, malodorous. States started today, completed cycle 2 days ago. Frequency: today Pertinent Negatives: Patient denies pain, rash, itching. Disposition: [] ED /[] Urgent Care (no appt availability in office) / [] Appointment(In office/virtual)/ []  [facility]/ [] Home Care/ [] Refused Recommended Disposition /[]  Mobile Bus/ [x]  Follow-up with PCP Additional Notes:  Pt reports H/O BV. No availability at practice. Asking if med could be called in for her "Since I've had this a few times before." Advised may need appt but assured NT would route to practice for PCPs review and final disposition. Please advise.   Reason for Disposition  Bad smelling vaginal discharge  Answer Assessment - Initial Assessment Questions 1. DISCHARGE: "Describe the discharge." (e.g., white, yellow, green, gray, foamy, cottage cheese-like)     Thin, clear 2. ODOR: "Is there a bad odor?"     Yes 3. ONSET: "When did the discharge begin?"     Today.  Finished cycle 2 days ago 4. RASH: "Is there a rash in the genital area?" If Yes, ask: "Describe it." (e.g., redness, blisters, sores, bumps)     No 5. ABDOMEN PAIN: "Are you having any abdomen pain?" If Yes, ask: "What does it feel like? " (e.g., crampy, dull, intermittent, constant)      No 6. ABDOMEN PAIN SEVERITY: If present, ask: "How bad is it?" (e.g., Scale 1-10; mild, moderate, or severe)   - MILD (1-3): Doesn't interfere with normal activities, abdomen soft and not tender to touch.    - MODERATE (4-7): Interferes with normal activities or awakens from sleep, abdomen tender to touch.    - SEVERE (8-10): Excruciating pain, doubled over, unable to do any normal activities. (R/O peritonitis)      No 7. CAUSE: "What do you think is causing the discharge?" "Have  you had the same problem before? What happened then?"     BV 8. OTHER SYMPTOMS: "Do you have any other symptoms?" (e.g., fever, itching, vaginal bleeding, pain with urination, injury to genital area, vaginal foreign body)     Itchy  Protocols used: Vaginal Discharge-A-AH

## 2023-04-29 NOTE — Telephone Encounter (Signed)
Please schedule appt for patient for possible BV

## 2023-04-29 NOTE — Telephone Encounter (Signed)
Called pt and left vm to call office back to schedule. OV currently available 12/18 at 9:40am

## 2023-05-15 ENCOUNTER — Ambulatory Visit (HOSPITAL_COMMUNITY)
Admission: EM | Admit: 2023-05-15 | Discharge: 2023-05-15 | Disposition: A | Discharge status: Home / Self Care | Payer: 59 | Attending: Family Medicine | Admitting: Family Medicine

## 2023-05-15 ENCOUNTER — Encounter (HOSPITAL_COMMUNITY): Payer: Self-pay

## 2023-05-15 DIAGNOSIS — L509 Urticaria, unspecified: Secondary | ICD-10-CM

## 2023-05-15 MED ORDER — PREDNISONE 10 MG PO TABS: 10.0000 mg | ORAL_TABLET | Freq: Every day | ORAL | 0 refills | Status: AC

## 2023-05-15 NOTE — ED Triage Notes (Signed)
 Patient here today with c/o rash on arms and legs X 3 days. She has tried calamine lotion, benadryl cream, and benadryl pill with little relief. She is allergic to something in Gain detergent and her boyfriend used it to wash his sheets.

## 2023-05-15 NOTE — Discharge Instructions (Addendum)
 Take the prednisone as prescribed on the directions.  Make sure to take all pills. You may experience side effects including irritation, appetite changes, and sleeping difficulty Continue topical Benadryl cream as well as moisturizing emollients

## 2023-05-15 NOTE — ED Provider Notes (Signed)
 [facility]    CSN: [id number] Arrival date & time: 05/15/23  0803      History   Chief Complaint Chief Complaint  Patient presents with   Rash    HPI Gabriella Ingram is a 22 y.o. female.   The patient is a 23 year old female presenting with 3 days of rash after exposure to a known allergic substance.  She slept in the sheets I washed and gain for several nights and now has developed a pruritic rash on her arms and legs most particularly on the antecubital space and upper thighs.  Has not had any fever, chills, areas of drainage, wheezing, facial swelling, chest pain, shortness of breath, nausea, vomiting, or diarrhea.  Benadryl  cream and calamine lotion has not helped and neither has oral antihistamines.  This has happened in the past but not this badly.  She had difficulty sleeping because of this.  The history is provided by the patient.  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    Patient Active Problem List   Diagnosis Date Noted   Bacterial vaginitis 06/28/2021   Candida vaginitis 06/28/2021   Anxiety and depression 03/08/2021   Preterm premature rupture of membranes (PPROM) with unknown onset of labor 11/08/2018   Leukocytes in urine 10/09/2018   Rash 09/03/2018   Supervision of other normal pregnancy, antepartum 07/24/2018   Chlamydia infection affecting pregnancy 07/24/2018   Dichorionic diamniotic twin gestation 07/02/2018    Past Surgical History:  Procedure Laterality Date   NO PAST SURGERIES      OB History     Gravida  1   Para  1   Term      Preterm  1   AB      Living  2      SAB      IAB      Ectopic      Multiple  1   Live Births  2            Home Medications     Prior to Admission medications   Medication Sig Start Date End Date Taking? Authorizing Provider  predniSONE  (DELTASONE ) 10 MG tablet Take 1 tablet (10 mg total) by mouth daily with breakfast for 21 doses. Take 6 tablets for day one, then 5 tablets on day 2, then 4 tablets on day 3, then 3 tablets on day 4, then 2 tablets on day 5, then 1 tablet on day 6. 05/15/23 06/05/23 Yes Janet Lonni BRAVO, MD  naproxen  (NAPROSYN ) 500 MG tablet Take 1 tablet (500 mg total) by mouth 2 (two) times daily with a meal. 09/25/21   Raspet, Rocky POUR, PA-C    Family History Family History  Problem Relation Age of Onset   Anxiety disorder Mother    Depression Mother    Cancer Mother    Miscarriages / Stillbirths Mother    Arthritis Maternal Grandmother    Hypertension Maternal Grandmother    Arthritis Maternal Grandfather    Diabetes Maternal Grandfather    Cancer Maternal Grandfather    Hypertension Maternal Grandfather     Social History Social History   Tobacco Use   Smoking status: Never    Passive exposure: Never   Smokeless  tobacco: Never  Vaping Use   Vaping status: Never Used  Substance Use Topics   Alcohol use: No   Drug use: No     Allergies   Other and Tomato   Review of Systems Review of Systems  Constitutional:  Negative for chills, diaphoresis, fatigue and fever.  HENT:  Negative for drooling, facial swelling, rhinorrhea and tinnitus.   Eyes:  Negative for visual disturbance.  Respiratory:  Negative for chest tightness, shortness of breath, wheezing and stridor.   Cardiovascular:  Negative for chest pain and palpitations.  Gastrointestinal:  Negative for abdominal pain, diarrhea, nausea and vomiting.  Musculoskeletal:  Negative for arthralgias, joint swelling and myalgias.  Skin:  Positive for rash.  Neurological:  Negative for dizziness and light-headedness.     Physical Exam Triage Vital Signs ED Triage Vitals  Encounter Vitals Group     BP 05/15/23 0830 133/89      Systolic BP Percentile --      Diastolic BP Percentile --      Pulse Rate 05/15/23 0830 82     Resp 05/15/23 0830 16     Temp 05/15/23 0830 98.1 F (36.7 C)     Temp Source 05/15/23 0830 Oral     SpO2 05/15/23 0830 98 %     Weight 05/15/23 0830 155 lb (70.3 kg)     Height 05/15/23 0830 5' 6 (1.676 m)     Head Circumference --      Peak Flow --      Pain Score 05/15/23 0828 0     Pain Loc --      Pain Education --      Exclude from Growth Chart --    No data found.  Updated Vital Signs BP 133/89 (BP Location: Left Arm)   Pulse 82   Temp 98.1 F (36.7 C) (Oral)   Resp 16   Ht 5' 6 (1.676 m)   Wt 70.3 kg   LMP 04/22/2023 (Exact Date)   SpO2 98%   Breastfeeding No   BMI 25.02 kg/m   Visual Acuity Right Eye Distance:   Left Eye Distance:   Bilateral Distance:    Right Eye Near:   Left Eye Near:    Bilateral Near:     Physical Exam Vitals reviewed.  Constitutional:      General: She is not in acute distress.    Appearance: Normal appearance. She is not toxic-appearing.  HENT:     Nose: Nose normal. No congestion.  Eyes:     General:        Right eye: No discharge.        Left eye: No discharge.     Extraocular Movements: Extraocular movements intact.     Conjunctiva/sclera: Conjunctivae normal.     Pupils: Pupils are equal, round, and reactive to light.  Pulmonary:     Effort: Pulmonary effort is normal.  Musculoskeletal:     Cervical back: Normal range of motion.     Right lower leg: No edema.     Left lower leg: No edema.  Skin:    Capillary Refill: Capillary refill takes 2 to 3 seconds.     Comments: Small scattered, punctate raised, irregular patterned, areas of erythema covering the inner thighs and antecubital areas of the upper extremities w/ some areas of coalescence and excoriations present. No pustules, bullae, drainage or bleeding.   Neurological:     Mental Status: She is alert.  Psychiatric:  Mood and Affect: Mood normal.       UC Treatments / Results  Labs (all labs ordered are listed, but only abnormal results are displayed) Labs Reviewed - No data to display  EKG   Radiology No results found.  Procedures Procedures (including critical care time)  Medications Ordered in UC Medications - No data to display  Initial Impression / Assessment and Plan / UC Course  I have reviewed the triage vital signs and the nursing notes.  Pertinent labs & imaging results that were available during my care of the patient were reviewed by me and considered in my medical decision making (see chart for details).     Urticaria  - The patient has an exposure to a known allergic irritant over the last two nights. Her pruritic rash is consistent with urticaria but has not responded to topical treatments.  - Given her surface area involved and irritation we discussed options for treatment and the patient would like to try oral steroids which is reasonable - We discussed the possible side effects.  - She can continue topical antihistamine and emollient creams as well - I counseled the patient on avoiding further exposure including cleaning clothing and bedding - She may benefit from a referral to an allergist in the future if she continues to have triggers.   Final Clinical Impressions(s) / UC Diagnoses   Final diagnoses:  Urticaria     Discharge Instructions      Take the prednisone  as prescribed on the directions.  Make sure to take all pills. You may experience side effects including irritation, appetite changes, and sleeping difficulty Continues topical Benadryl  cream as well as moisturizing emollients      ED Prescriptions     Medication Sig Dispense Auth. Provider   predniSONE  (DELTASONE ) 10 MG tablet Take 1 tablet (10 mg total) by mouth daily with breakfast for 21 doses. Take 6 tablets for day one, then 5 tablets on day 2, then 4 tablets on day 3, then 3 tablets on day 4, then 2 tablets on day 5,  then 1 tablet on day 6. 21 tablet Janet Lonni BRAVO, MD      PDMP not reviewed this encounter.   Janet Lonni BRAVO, MD 05/15/23 2329

## 2023-05-28 ENCOUNTER — Telehealth: Payer: 59 | Admitting: Physician Assistant

## 2023-05-28 DIAGNOSIS — B9689 Other specified bacterial agents as the cause of diseases classified elsewhere: Secondary | ICD-10-CM | POA: Diagnosis not present

## 2023-05-28 DIAGNOSIS — N760 Acute vaginitis: Secondary | ICD-10-CM | POA: Diagnosis not present

## 2023-05-28 MED ORDER — METRONIDAZOLE 500 MG PO TABS: 500.0000 mg | ORAL_TABLET | Freq: Two times a day (BID) | ORAL | 0 refills | Status: AC

## 2023-05-28 NOTE — Progress Notes (Signed)
 Virtual Visit Consent   Gabriella Ingram, you are scheduled for a virtual visit with a [facility] provider today. Just as with appointments in the office, your consent must be obtained to participate. Your consent will be active for this visit and any virtual visit you may have with one of our providers in the next 365 days. If you have a MyChart account, a copy of this consent can be sent to you electronically.  As this is a virtual visit, video technology does not allow for your provider to perform a traditional examination. This may limit your provider's ability to fully assess your condition. If your provider identifies any concerns that need to be evaluated in person or the need to arrange testing (such as labs, EKG, etc.), we will make arrangements to do so. Although advances in technology are sophisticated, we cannot ensure that it will always work on either your end or our end. If the connection with a video visit is poor, the visit may have to be switched to a telephone visit. With either a video or telephone visit, we are not always able to ensure that we have a secure connection.  By engaging in this virtual visit, you consent to the provision of healthcare and authorize for your insurance to be billed (if applicable) for the services provided during this visit. Depending on your insurance coverage, you may receive a charge related to this service.  I need to obtain your verbal consent now. Are you willing to proceed with your visit today? Gabriella Ingram has provided verbal consent on 05/28/2023 for a virtual visit (video or telephone). Angelia Kelp, PA-C  Date: 05/28/2023 3:35 PM  Virtual Visit via Video Note   I, Angelia Kelp, connected with  Gabriella Ingram  ([id number], 07-05-2000) on 05/28/23 at  3:30 PM EST by a video-enabled telemedicine application and verified that I am speaking with the correct person using two identifiers.  Location: Patient: Virtual Visit Location Patient:  Mobile Provider: Virtual Visit Location Provider: Home Office   I discussed the limitations of evaluation and management by telemedicine and the availability of in person appointments. The patient expressed understanding and agreed to proceed.    History of Present Illness: Gabriella Ingram is a 23 y.o. who identifies as a female who was assigned female at birth, and is being seen today for vaginal discharge.  HPI: Vaginal Discharge The patient's primary symptoms include a genital odor and vaginal discharge. The patient's pertinent negatives include no genital itching. This is a new problem. The problem occurs constantly. The problem has been gradually worsening. The patient is experiencing no pain. Pertinent negatives include no abdominal pain, back pain, chills, fever or nausea. The vaginal discharge was malodorous and mucoid. There has been no bleeding. She has not been passing clots. She has not been passing tissue. Nothing aggravates the symptoms. She has tried nothing for the symptoms. The treatment provided no relief.   Had been on Prednisone  due to allergic reaction and then just finished her menstrual cycle. Had her clothes washed in [place] that caused the initial allergic reaction and is unsure if she may have worn any undergarments that may have been washed also that precipitated the issue.  She does have a history of BV, normally following mentrual cycles, but has had with any simple changes as well.    Problems:  Patient Active Problem List   Diagnosis Date Noted   Bacterial vaginitis 06/28/2021   Candida vaginitis 06/28/2021   Anxiety and  depression 03/08/2021   Preterm premature rupture of membranes (PPROM) with unknown onset of labor 11/08/2018   Leukocytes in urine 10/09/2018   Rash 09/03/2018   Supervision of other normal pregnancy, antepartum 07/24/2018   Chlamydia infection affecting pregnancy 07/24/2018   Dichorionic diamniotic twin gestation 07/02/2018    Allergies:   Allergies  Allergen Reactions   Other     Food allergy -tomatoes- rash, hives, SOB Dove soap- rash   Tomato Itching   Medications:  Current Outpatient Medications:    metroNIDAZOLE  (FLAGYL ) 500 MG tablet, Take 1 tablet (500 mg total) by mouth 2 (two) times daily for 7 days., Disp: 14 tablet, Rfl: 0   naproxen  (NAPROSYN ) 500 MG tablet, Take 1 tablet (500 mg total) by mouth 2 (two) times daily with a meal., Disp: 30 tablet, Rfl: 0   predniSONE  (DELTASONE ) 10 MG tablet, Take 1 tablet (10 mg total) by mouth daily with breakfast for 21 doses. Take 6 tablets for day one, then 5 tablets on day 2, then 4 tablets on day 3, then 3 tablets on day 4, then 2 tablets on day 5, then 1 tablet on day 6., Disp: 21 tablet, Rfl: 0  Observations/Objective: Patient is well-developed, well-nourished in no acute distress.  Resting comfortably  Head is normocephalic, atraumatic.  No labored breathing.  Speech is clear and coherent with logical content.  Patient is alert and oriented at baseline.    Assessment and Plan: 1. BV (bacterial vaginosis) (Primary) - metroNIDAZOLE  (FLAGYL ) 500 MG tablet; Take 1 tablet (500 mg total) by mouth 2 (two) times daily for 7 days.  Dispense: 14 tablet; Refill: 0  - Symptoms consistent with BV - Metronidazole  prescribed - Limit bubble baths, scented lotions/soaps/detergents - Limit tight fitting clothing - Seek on person evaluation if not improving or if symptoms worsen   Follow Up Instructions: I discussed the assessment and treatment plan with the patient. The patient was provided an opportunity to ask questions and all were answered. The patient agreed with the plan and demonstrated an understanding of the instructions.  A copy of instructions were sent to the patient via MyChart unless otherwise noted below.    The patient was advised to call back or seek an in-person evaluation if the symptoms worsen or if the condition fails to improve as anticipated.     Angelia Kelp, PA-C

## 2023-05-28 NOTE — Patient Instructions (Signed)
 Gabriella Ingram, thank you for joining Gabriella Kelp, PA-C for today's virtual visit.  While this provider is not your primary care provider (PCP), if your PCP is located in our provider database this encounter information will be shared with them immediately following your visit.   A Gabriella Ingram account gives you access to today's visit and all your visits, tests, and labs performed at [facility] " click here if you don't have a Gabriella Ingram account or go to [email or web address]  Consent: (Patient) Gabriella Ingram provided verbal consent for this virtual visit at the beginning of the encounter.  Current Medications:  Current Outpatient Medications:    metroNIDAZOLE  (FLAGYL ) 500 MG tablet, Take 1 tablet (500 mg total) by mouth 2 (two) times daily for 7 days., Disp: 14 tablet, Rfl: 0   naproxen  (NAPROSYN ) 500 MG tablet, Take 1 tablet (500 mg total) by mouth 2 (two) times daily with a meal., Disp: 30 tablet, Rfl: 0   predniSONE  (DELTASONE ) 10 MG tablet, Take 1 tablet (10 mg total) by mouth daily with breakfast for 21 doses. Take 6 tablets for day one, then 5 tablets on day 2, then 4 tablets on day 3, then 3 tablets on day 4, then 2 tablets on day 5, then 1 tablet on day 6., Disp: 21 tablet, Rfl: 0   Medications ordered in this encounter:  Meds ordered this encounter  Medications   metroNIDAZOLE  (FLAGYL ) 500 MG tablet    Sig: Take 1 tablet (500 mg total) by mouth 2 (two) times daily for 7 days.    Dispense:  14 tablet    Refill:  0    Supervising Provider:   Corine Dice [[id number]]     *If you need refills on other medications prior to your next appointment, please contact your pharmacy*  Follow-Up: Call back or seek an in-person evaluation if the symptoms worsen or if the condition fails to improve as anticipated.   Virtual Care [phone]  Other Instructions Bacterial Vaginosis  Bacterial vaginosis is an infection that occurs when  the normal balance of bacteria in the vagina changes. This change is caused by an overgrowth of certain bacteria in the vagina. Bacterial vaginosis is the most common vaginal infection among females aged 19 to 81 years. This condition increases the risk of sexually transmitted infections (STIs). Treatment can help reduce this risk. Treatment is very important for pregnant women because this condition can cause babies to be born early (prematurely) or at a low birth weight. What are the causes? This condition is caused by an increase in harmful bacteria that are normally present in small amounts in the vagina. However, the exact reason this condition develops is not known. You cannot get bacterial vaginosis from toilet seats, bedding, swimming pools, or contact with objects around you. What increases the risk? The following factors may make you more likely to develop this condition: Having a new sexual partner or multiple sexual partners, or having unprotected sex. Douching. Having an intrauterine device (IUD). Smoking. Abusing drugs and alcohol. This may lead to riskier sexual behavior. Taking certain antibiotic medicines. Being pregnant. What are the signs or symptoms? Some women with this condition have no symptoms. Symptoms may include: Gabriella Ingram or white vaginal discharge. The discharge can be watery or foamy. A fish-like odor with discharge, especially after sex or during menstruation. Itching in and around the vagina. Burning or pain with urination. How is this diagnosed? This condition is diagnosed based on: Your medical  history. A physical exam of the vagina. Checking a sample of vaginal fluid for harmful bacteria or abnormal cells. How is this treated? This condition is treated with antibiotic medicines. These may be given as a pill, a vaginal cream, or a medicine that is put into the vagina (suppository). If the condition comes back after treatment, a second round of antibiotics may be  needed. Follow these instructions at home: Medicines Take or apply over-the-counter and prescription medicines only as told by your health care provider. Take or apply your antibiotic medicine as told by your health care provider. Do not stop using the antibiotic even if you start to feel better. General instructions If you have a female sexual partner, tell her that you have a vaginal infection. She should follow up with her health care provider. If you have a female sexual partner, he does not need treatment. Avoid sexual activity until you finish treatment. Drink enough fluid to keep your urine pale yellow. Keep the area around your vagina and rectum clean. Wash the area daily with warm water. Wipe yourself from front to back after using the toilet. If you are breastfeeding, talk to your health care provider about continuing breastfeeding during treatment. Keep all follow-up visits. This is important. How is this prevented? Self-care Do not douche. Wash the outside of your vagina with warm water only. Wear cotton or cotton-lined underwear. Avoid wearing tight pants and pantyhose, especially during the summer. Safe sex Use protection when having sex. This includes: Using condoms. Using dental dams. This is a thin layer of a material made of latex or polyurethane that protects the mouth during oral sex. Limit the number of sexual partners. To help prevent bacterial vaginosis, it is best to have sex with just one partner (monogamous relationship). Make sure you and your sexual partner are tested for STIs. Drugs and alcohol Do not use any products that contain nicotine or tobacco. These products include cigarettes, chewing tobacco, and vaping devices, such as e-cigarettes. If you need help quitting, ask your health care provider. Do not use drugs. Do not drink alcohol if: Your health care provider tells you not to do this. You are pregnant, may be pregnant, or are planning to become  pregnant. If you drink alcohol: Limit how much you have to 0-1 drink a day. Be aware of how much alcohol is in your drink. In the [place] one drink equals one 12 oz bottle of beer (355 mL), one 5 oz glass of wine (148 mL), or one 1 oz glass of hard liquor (44 mL). Where to find more information Centers for Disease Control and Prevention: FootballExhibition.com.br American Sexual Health Association (ASHA): www.ashastd.org U.S. Department of Health and Health and safety inspector, Office on Women's Health: http://hoffman.com/ Contact a health care provider if: Your symptoms do not improve, even after treatment. You have more discharge or pain when urinating. You have a fever or chills. You have pain in your abdomen or pelvis. You have pain during sex. You have vaginal bleeding between menstrual periods. Summary Bacterial vaginosis is a vaginal infection that occurs when the normal balance of bacteria in the vagina changes. It results from an overgrowth of certain bacteria. This condition increases the risk of sexually transmitted infections (STIs). Getting treated can help reduce this risk. Treatment is very important for pregnant women because this condition can cause babies to be born early (prematurely) or at low birth weight. This condition is treated with antibiotic medicines. These may be given as a pill, a vaginal  cream, or a medicine that is put into the vagina (suppository). This information is not intended to replace advice given to you by your health care provider. Make sure you discuss any questions you have with your health care provider. Document Revised: 10/28/2019 Document Reviewed: 10/28/2019 Elsevier Patient Education  2024 Elsevier Inc.    If you have been instructed to have an in-person evaluation today at a local Urgent Care facility, please use the link below. It will take you to a list of all of our available [facility], including address, phone number and hours of operation.  Please do not delay care.  [facility]  If you or a family member do not have a primary care provider, use the link below to schedule a visit and establish care. When you choose a [facility] primary care physician or advanced practice provider, you gain a long-term partner in health. Find a Primary Care Provider  Learn more about [facility]'s in-office and virtual care options: [facility] - Get Care Now

## 2023-07-14 ENCOUNTER — Other Ambulatory Visit (HOSPITAL_COMMUNITY)
Admission: RE | Admit: 2023-07-14 | Discharge: 2023-07-14 | Disposition: A | Discharge status: Home / Self Care | Source: Ambulatory Visit | Attending: Family | Admitting: Family

## 2023-07-14 ENCOUNTER — Ambulatory Visit (INDEPENDENT_AMBULATORY_CARE_PROVIDER_SITE_OTHER): Payer: 59 | Admitting: Family

## 2023-07-14 VITALS — BP 126/80 | HR 85 | Temp 98.4°F | Ht 66.0 in | Wt 168.2 lb

## 2023-07-14 DIAGNOSIS — Z13228 Encounter for screening for other metabolic disorders: Secondary | ICD-10-CM

## 2023-07-14 DIAGNOSIS — Z1159 Encounter for screening for other viral diseases: Secondary | ICD-10-CM

## 2023-07-14 DIAGNOSIS — Z113 Encounter for screening for infections with a predominantly sexual mode of transmission: Secondary | ICD-10-CM

## 2023-07-14 DIAGNOSIS — Z13220 Encounter for screening for lipoid disorders: Secondary | ICD-10-CM

## 2023-07-14 DIAGNOSIS — Z0000 Encounter for general adult medical examination without abnormal findings: Secondary | ICD-10-CM | POA: Diagnosis not present

## 2023-07-14 DIAGNOSIS — Z131 Encounter for screening for diabetes mellitus: Secondary | ICD-10-CM

## 2023-07-14 DIAGNOSIS — Z124 Encounter for screening for malignant neoplasm of cervix: Secondary | ICD-10-CM | POA: Diagnosis present

## 2023-07-14 DIAGNOSIS — Z114 Encounter for screening for human immunodeficiency virus [HIV]: Secondary | ICD-10-CM

## 2023-07-14 DIAGNOSIS — Z130 Encounter for screening for diseases of the blood and blood-forming organs and certain disorders involving the immune mechanism: Secondary | ICD-10-CM | POA: Diagnosis not present

## 2023-07-14 DIAGNOSIS — Z1329 Encounter for screening for other suspected endocrine disorder: Secondary | ICD-10-CM

## 2023-07-14 NOTE — Progress Notes (Signed)
 Patient ID: Gabriella Ingram, female    DOB: [birth year]  MRN: [id number]  CC: Annual Exam  Subjective: Gabriella Ingram is a 23 y.o. female who presents for annual exam.  Her concerns today include:  Reports she is currently on menses. No issues/concerns for discussion today.  Patient Active Problem List   Diagnosis Date Noted   Bacterial vaginitis 06/28/2021   Candida vaginitis 06/28/2021   Anxiety and depression 03/08/2021   Preterm premature rupture of membranes (PPROM) with unknown onset of labor 11/08/2018   Leukocytes in urine 10/09/2018   Rash 09/03/2018   Supervision of other normal pregnancy, antepartum 07/24/2018   Chlamydia infection affecting pregnancy 07/24/2018   Dichorionic diamniotic twin gestation 07/02/2018     Current Outpatient Medications on File Prior to Visit  Medication Sig Dispense Refill   naproxen (NAPROSYN) 500 MG tablet Take 1 tablet (500 mg total) by mouth 2 (two) times daily with a meal. 30 tablet 0   No current facility-administered medications on file prior to visit.    Allergies  Allergen Reactions   Other     Food allergy -tomatoes- rash, hives, SOB    Tomato Itching    Social History   Socioeconomic History   Marital status: Single    Spouse name: Not on file   Number of children: Not on file   Years of education: Not on file   Highest education level: Not on file  Occupational History   Occupation: unemployed  Tobacco Use   Smoking status: Never    Passive exposure: Never   Smokeless tobacco: Never  Vaping Use   Vaping status: Never Used  Substance and Sexual Activity   Alcohol use: No   Drug use: No   Sexual activity: Not Currently    Birth control/protection: None  Other Topics Concern   Not on file  Social History Narrative   Not on file   Social Drivers of Health   Financial Resource Strain: Low Risk  (07/14/2023)   Overall Financial Resource Strain (CARDIA)    Difficulty of Paying Living Expenses: Not hard at all   Food Insecurity: Not on file  Transportation Needs: Not on file  Physical Activity: Inactive (07/14/2023)   Exercise Vital Sign    Days of Exercise per Week: 0 days    Minutes of Exercise per Session: 0 min  Stress: Stress Concern Present (07/14/2023)   [employer] of Occupational Health - Occupational Stress Questionnaire    Feeling of Stress : Very much  Social Connections: Moderately Isolated (07/14/2023)   Social Connection and Isolation Panel [NHANES]    Frequency of Communication with Friends and Family: More than three times a week    Frequency of Social Gatherings with Friends and Family: Twice a week    Attends Religious Services: More than 4 times per year    Active Member of [employer] or Organizations: No    Attends Banker Meetings: Never    Marital Status: Never married  Intimate Partner Violence: Not At Risk (07/14/2023)   Humiliation, Afraid, Rape, and Kick questionnaire    Fear of Current or Ex-Partner: No    Emotionally Abused: No    Physically Abused: No    Sexually Abused: No    Family History  Problem Relation Age of Onset   Anxiety disorder Mother    Depression Mother    Cancer Mother    Miscarriages / Stillbirths Mother    Arthritis Maternal Grandmother    Hypertension Maternal Grandmother  Arthritis Maternal Grandfather    Diabetes Maternal Grandfather    Cancer Maternal Grandfather    Hypertension Maternal Grandfather     Past Surgical History:  Procedure Laterality Date   NO PAST SURGERIES      ROS: Review of Systems Negative except as stated above  PHYSICAL EXAM: BP 126/80   Pulse 85   Temp 98.4 F (36.9 C) (Oral)   Ht 5\' 6"  (1.676 m)   Wt 168 lb 3.2 oz (76.3 kg)   LMP 07/11/2023 (Exact Date)   SpO2 98%   BMI 27.15 kg/m   Physical Exam HENT:     Head: Normocephalic and atraumatic.     Right Ear: Tympanic membrane, ear canal and external ear normal.     Left Ear: Tympanic membrane, ear canal and external ear normal.      Nose: Nose normal.     Mouth/Throat:     Mouth: Mucous membranes are moist.     Pharynx: Oropharynx is clear.  Eyes:     Extraocular Movements: Extraocular movements intact.     Conjunctiva/sclera: Conjunctivae normal.     Pupils: Pupils are equal, round, and reactive to light.  Neck:     Thyroid: No thyroid mass, thyromegaly or thyroid tenderness.  Cardiovascular:     Rate and Rhythm: Normal rate and regular rhythm.     Pulses: Normal pulses.     Heart sounds: Normal heart sounds.  Pulmonary:     Effort: Pulmonary effort is normal.     Breath sounds: Normal breath sounds.  Chest:     Comments: Patient declined. Abdominal:     General: Bowel sounds are normal.     Palpations: Abdomen is soft.  Genitourinary:    General: Normal vulva.     Vagina: Normal.     Cervix: Normal.     Uterus: Normal.      Adnexa: Right adnexa normal and left adnexa normal.     Comments: Curley Spice, CMA. Musculoskeletal:        General: Normal range of motion.     Right shoulder: Normal.     Left shoulder: Normal.     Right upper arm: Normal.     Left upper arm: Normal.     Right elbow: Normal.     Left elbow: Normal.     Right forearm: Normal.     Left forearm: Normal.     Right wrist: Normal.     Left wrist: Normal.     Right hand: Normal.     Left hand: Normal.     Cervical back: Normal, normal range of motion and neck supple.     Thoracic back: Normal.     Lumbar back: Normal.     Right hip: Normal.     Left hip: Normal.     Right upper leg: Normal.     Left upper leg: Normal.     Right knee: Normal.     Left knee: Normal.     Right lower leg: Normal.     Left lower leg: Normal.     Right ankle: Normal.     Left ankle: Normal.     Right foot: Normal.     Left foot: Normal.  Skin:    General: Skin is warm and dry.     Capillary Refill: Capillary refill takes less than 2 seconds.  Neurological:     General: No focal deficit present.     Mental Status: She is alert  and  oriented to person, place, and time.  Psychiatric:        Mood and Affect: Mood normal.        Behavior: Behavior normal.     ASSESSMENT AND PLAN: 1. Annual physical exam (Primary) - Counseled on 150 minutes of exercise per week as tolerated, healthy eating (including decreased daily intake of saturated fats, cholesterol, added sugars, sodium), STI prevention, and routine healthcare maintenance.  2. Screening for metabolic disorder - Routine screening.  - CMP14+EGFR  3. Screening for deficiency anemia - Routine screening.  - CBC  4. Diabetes mellitus screening - Routine screening.  - Hemoglobin A1c  5. Screening cholesterol level - Routine screening.  - Lipid panel  6. Thyroid disorder screen - Routine screening.  - TSH  7. Pap smear for cervical cancer screening - Routine screening.  - Cytology - PAP([place])  8. Routine screening for STI (sexually transmitted infection) - Routine screening.  - Cervicovaginal ancillary only  9. Encounter for screening for HIV - Routine screening.  - HIV antibody (with reflex)  10. Need for hepatitis C screening test - Routine screening.  - Hepatitis C Antibody   Patient was given the opportunity to ask questions.  Patient verbalized understanding of the plan and was able to repeat key elements of the plan. Patient was given clear instructions to go to Emergency Department or return to medical center if symptoms don't improve, worsen, or new problems develop.The patient verbalized understanding.   Orders Placed This Encounter  Procedures   CBC   Lipid panel   CMP14+EGFR   Hemoglobin A1c   TSH   Hepatitis C Antibody   HIV antibody (with reflex)    Return in about 1 year (around 07/13/2024) for Physical per patient preference.  Rema Fendt, NP

## 2023-07-14 NOTE — Progress Notes (Signed)
 Patient states she is on cycle and will do a pap smear if its okay to do.

## 2023-07-15 ENCOUNTER — Other Ambulatory Visit: Payer: Self-pay | Admitting: Family

## 2023-07-15 ENCOUNTER — Encounter: Payer: Self-pay | Admitting: Family

## 2023-07-15 DIAGNOSIS — B9689 Other specified bacterial agents as the cause of diseases classified elsewhere: Secondary | ICD-10-CM

## 2023-07-15 LAB — TSH: TSH: 2.18 u[IU]/mL (ref 0.450–4.500)

## 2023-07-15 LAB — CMP14+EGFR
ALT: 10 IU/L (ref 0–32)
AST: 18 IU/L (ref 0–40)
Albumin: 4 g/dL (ref 4.0–5.0)
Alkaline Phosphatase: 68 IU/L (ref 44–121)
BUN/Creatinine Ratio: 13 (ref 9–23)
BUN: 9 mg/dL (ref 6–20)
Bilirubin Total: 0.3 mg/dL (ref 0.0–1.2)
CO2: 21 mmol/L (ref 20–29)
Calcium: 9 mg/dL (ref 8.7–10.2)
Chloride: 105 mmol/L (ref 96–106)
Creatinine, Ser: 0.72 mg/dL (ref 0.57–1.00)
Globulin, Total: 2.7 g/dL (ref 1.5–4.5)
Glucose: 78 mg/dL (ref 70–99)
Potassium: 4.2 mmol/L (ref 3.5–5.2)
Sodium: 138 mmol/L (ref 134–144)
Total Protein: 6.7 g/dL (ref 6.0–8.5)
eGFR: 120 mL/min/{1.73_m2} (ref >59)

## 2023-07-15 LAB — CBC
Hematocrit: 37.2 % (ref 34.0–46.6)
Hemoglobin: 12.2 g/dL (ref 11.1–15.9)
MCH: 28.3 pg (ref 26.6–33.0)
MCHC: 32.8 g/dL (ref 31.5–35.7)
MCV: 86 fL (ref 79–97)
Platelets: 155 10*3/uL (ref 150–450)
RBC: 4.31 x10E6/uL (ref 3.77–5.28)
RDW: 13.7 % (ref 11.7–15.4)
WBC: 4.5 10*3/uL (ref 3.4–10.8)

## 2023-07-15 LAB — CERVICOVAGINAL ANCILLARY ONLY
Bacterial Vaginitis (gardnerella): POSITIVE — AB
Candida Glabrata: NEGATIVE
Candida Vaginitis: NEGATIVE
Chlamydia: NEGATIVE
Comment: NEGATIVE
Comment: NEGATIVE
Comment: NEGATIVE
Comment: NEGATIVE
Comment: NEGATIVE
Comment: NORMAL
Neisseria Gonorrhea: NEGATIVE
Trichomonas: NEGATIVE

## 2023-07-15 LAB — LIPID PANEL
Chol/HDL Ratio: 2.8 ratio (ref 0.0–4.4)
Cholesterol, Total: 138 mg/dL (ref 100–199)
HDL: 49 mg/dL (ref >39)
LDL Chol Calc (NIH): 76 mg/dL (ref 0–99)
Triglycerides: 62 mg/dL (ref 0–149)
VLDL Cholesterol Cal: 13 mg/dL (ref 5–40)

## 2023-07-15 LAB — HEMOGLOBIN A1C
Est. average glucose Bld gHb Est-mCnc: 117 mg/dL
Hgb A1c MFr Bld: 5.7 % — ABNORMAL HIGH (ref 4.8–5.6)

## 2023-07-15 LAB — HIV ANTIBODY (ROUTINE TESTING W REFLEX): HIV Screen 4th Generation wRfx: NONREACTIVE

## 2023-07-15 LAB — HEPATITIS C ANTIBODY: Hep C Virus Ab: NONREACTIVE

## 2023-07-15 MED ORDER — METRONIDAZOLE 500 MG PO TABS: 500.0000 mg | ORAL_TABLET | Freq: Two times a day (BID) | ORAL | 0 refills | Status: AC

## 2023-07-21 LAB — CYTOLOGY - PAP: Diagnosis: NEGATIVE

## 2023-09-16 ENCOUNTER — Encounter: Payer: Self-pay | Admitting: Family

## 2023-09-17 ENCOUNTER — Other Ambulatory Visit: Payer: Self-pay | Admitting: Family

## 2023-09-17 DIAGNOSIS — F439 Reaction to severe stress, unspecified: Secondary | ICD-10-CM

## 2023-09-17 NOTE — Telephone Encounter (Signed)
 Patient is aware that she should expect a call  from psychiatry

## 2023-09-17 NOTE — Telephone Encounter (Signed)
 Referral to Psychiatry. Expect call soon with appointment details.

## 2023-09-23 ENCOUNTER — Telehealth: Payer: Self-pay | Admitting: Family

## 2023-09-23 NOTE — Telephone Encounter (Signed)
 I called patient and made her aware the PCP Patient referred to Psychiatry on 09/17/2023. Provide patient with referral contact information. Please note referral contact information may be unavailable due to has not been 2 weeks since order placed.  I told her to call us  back in two weeks if she has not heard anything.

## 2023-09-23 NOTE — Telephone Encounter (Signed)
 Patient referred to Psychiatry on 09/17/2023. Provide patient with referral contact information. Please note referral contact information may be unavailable due to has not been 2 weeks since order placed.

## 2023-09-23 NOTE — Telephone Encounter (Signed)
 Copied from CRM [id number]. Topic: Referral - Request for Referral >> Sep 23, 2023  8:38 AM [place] B wrote: Did the patient discuss referral with their provider in the last year? yes   Type of order/referral and detailed reason for visit: psychiatrist for emotional support dog  Preference of office, provider, location: ?  If referral order, have you been seen by this specialty before? no  Can we respond through MyChart? yes

## 2024-03-05 ENCOUNTER — Encounter: Admitting: Family

## 2024-03-05 ENCOUNTER — Telehealth: Payer: Self-pay | Admitting: Family

## 2024-03-05 NOTE — Progress Notes (Signed)
 Erroneous encounter-disregard

## 2024-03-05 NOTE — Telephone Encounter (Signed)
 Called pt to reschedule missed appt; could not reach or leave vm

## 2024-03-09 ENCOUNTER — Ambulatory Visit: Admission: EM | Admit: 2024-03-09 | Discharge: 2024-03-09 | Disposition: A | Discharge status: Home / Self Care

## 2024-03-09 ENCOUNTER — Encounter: Payer: Self-pay | Admitting: Emergency Medicine

## 2024-03-09 ENCOUNTER — Telehealth

## 2024-03-09 DIAGNOSIS — Z113 Encounter for screening for infections with a predominantly sexual mode of transmission: Secondary | ICD-10-CM | POA: Diagnosis not present

## 2024-03-09 DIAGNOSIS — R300 Dysuria: Secondary | ICD-10-CM | POA: Insufficient documentation

## 2024-03-09 LAB — POCT URINE DIPSTICK
Bilirubin, UA: NEGATIVE
Blood, UA: NEGATIVE
Glucose, UA: NEGATIVE mg/dL
Ketones, POC UA: NEGATIVE mg/dL
Nitrite, UA: NEGATIVE
POC PROTEIN,UA: NEGATIVE
Spec Grav, UA: 1.015 (ref 1.010–1.025)
Urobilinogen, UA: 0.2 U/dL
pH, UA: 6.5 (ref 5.0–8.0)

## 2024-03-09 NOTE — ED Triage Notes (Addendum)
 Pt presents requesting STD testing. Pt reports sxs as vaginal discharge. Last sexual encounter 3 days.  Pt also c/o discomfort when urinating x 2 days.

## 2024-03-09 NOTE — ED Provider Notes (Signed)
 [facility]    CSN: [id number] Arrival date & time: 03/09/24  0801      History   Chief Complaint Chief Complaint  Patient presents with   Possible UTI   Exposure to STD    HPI Gabriella Ingram is a 23 y.o. female.  Here with urinary discomfort x 2 days No hematuria, urgency, frequency, flank pain, fever Vaginal discharge, thinks she may be ovulating. 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    Patient Active Problem List   Diagnosis Date Noted   Bacterial vaginitis 06/28/2021   Candida vaginitis 06/28/2021   Anxiety and depression 03/08/2021   Preterm premature rupture of membranes (PPROM) with unknown onset of labor 11/08/2018   Leukocytes in urine 10/09/2018   Rash 09/03/2018   Supervision of other normal pregnancy, antepartum 07/24/2018   Chlamydia infection affecting pregnancy 07/24/2018   Dichorionic diamniotic twin gestation 07/02/2018    Past Surgical History:  Procedure Laterality Date   NO PAST SURGERIES      OB History     Gravida  1   Para  1   Term      Preterm  1   AB      Living  2      SAB      IAB      Ectopic      Multiple  1   Live Births  2            Home Medications    Prior to Admission medications   Medication Sig Start Date End Date Taking? Authorizing Provider  norgestimate -ethinyl estradiol  (SPRINTEC 28) 0.25-35 MG-MCG tablet Take 1 tablet by mouth daily. 09/15/22  Yes [provider]  naproxen  (NAPROSYN ) 500 MG tablet Take 1 tablet (500 mg total) by mouth 2 (two) times daily with a meal. 09/25/21   Raspet, Rocky POUR, PA-C    Family History Family History  Problem Relation Age of Onset   Anxiety disorder Mother    Depression Mother    Cancer Mother    Miscarriages / Stillbirths Mother    Arthritis Maternal Grandmother    Hypertension Maternal Grandmother     Arthritis Maternal Grandfather    Diabetes Maternal Grandfather    Cancer Maternal Grandfather    Hypertension Maternal Grandfather     Social History Social History   Tobacco Use   Smoking status: Never    Passive exposure: Never   Smokeless tobacco: Never  Vaping Use   Vaping status: Never Used  Substance Use Topics   Alcohol use: No   Drug use: No     Allergies   Other and Tomato   Review of Systems Review of Systems As per HPI  Physical Exam Triage Vital Signs ED Triage Vitals  Encounter Vitals Group     BP 03/09/24 0818 133/88     Girls Systolic BP Percentile --      Girls Diastolic BP Percentile --      Boys Systolic BP Percentile --      Boys Diastolic BP Percentile --      Pulse Rate 03/09/24 0818 83     Resp 03/09/24 0818 18     Temp 03/09/24 0818 97.9 F (36.6 C)     Temp Source 03/09/24 0818 Oral     SpO2 03/09/24 0818 97 %  Weight 03/09/24 0817 168 lb 3.4 oz (76.3 kg)     Height --      Head Circumference --      Peak Flow --      Pain Score 03/09/24 0815 0     Pain Loc --      Pain Education --      Exclude from Growth Chart --    No data found.  Updated Vital Signs BP 133/88 (BP Location: Left Arm)   Pulse 83   Temp 97.9 F (36.6 C) (Oral)   Resp 18   Wt 168 lb 3.4 oz (76.3 kg)   LMP 02/29/2024 (Exact Date)   SpO2 97%   BMI 27.15 kg/m    Physical Exam Vitals and nursing note reviewed.  Constitutional:      General: She is not in acute distress. HENT:     Mouth/Throat:     Mouth: Mucous membranes are moist.     Pharynx: Oropharynx is clear.  Eyes:     Conjunctiva/sclera: Conjunctivae normal.     Pupils: Pupils are equal, round, and reactive to light.  Cardiovascular:     Rate and Rhythm: Normal rate and regular rhythm.     Heart sounds: Normal heart sounds.  Pulmonary:     Effort: Pulmonary effort is normal.     Breath sounds: Normal breath sounds.  Abdominal:     Palpations: Abdomen is soft.     Tenderness: There  is no abdominal tenderness. There is no right CVA tenderness or left CVA tenderness.  Neurological:     Mental Status: She is alert and oriented to person, place, and time.     UC Treatments / Results  Labs (all labs ordered are listed, but only abnormal results are displayed) Labs Reviewed  POCT URINE DIPSTICK - Abnormal; Notable for the following components:      Result Value   Leukocytes, UA Trace (*)    All other components within normal limits  URINE CULTURE  CERVICOVAGINAL ANCILLARY ONLY    EKG   Radiology No results found.  Procedures Procedures (including critical care time)  Medications Ordered in UC Medications - No data to display  Initial Impression / Assessment and Plan / UC Course  I have reviewed the triage vital signs and the nursing notes.  Pertinent labs & imaging results that were available during my care of the patient were reviewed by me and considered in my medical decision making (see chart for details).  UA trace leuks. Will culture.  Cytology swab pending. Treat positive as indicated Declines blood work today Increase oral fluid intake Return if needed No questions, agrees to plan   Final Clinical Impressions(s) / UC Diagnoses   Final diagnoses:  Screen for STD (sexually transmitted disease)  Dysuria     Discharge Instructions      We will call you if anything on your swab returns positive. You can also see these results on MyChart. Please abstain from sexual intercourse until your results return.  Staff will also contact you if anything results on your urine culture requiring treatment.   Drink lots of water!     ED Prescriptions   None    PDMP not reviewed this encounter.   Jeryl Stabs, [place] 03/09/24 [phone]

## 2024-03-09 NOTE — Discharge Instructions (Signed)
 We will call you if anything on your swab returns positive. You can also see these results on MyChart. Please abstain from sexual intercourse until your results return.  Staff will also contact you if anything results on your urine culture requiring treatment.   Drink lots of water!

## 2024-03-10 ENCOUNTER — Ambulatory Visit (HOSPITAL_COMMUNITY): Payer: Self-pay

## 2024-03-10 LAB — URINE CULTURE: Culture: NO GROWTH

## 2024-03-10 LAB — CERVICOVAGINAL ANCILLARY ONLY
Bacterial Vaginitis (gardnerella): POSITIVE — AB
Candida Glabrata: NEGATIVE
Candida Vaginitis: NEGATIVE
Chlamydia: NEGATIVE
Comment: NEGATIVE
Comment: NEGATIVE
Comment: NEGATIVE
Comment: NEGATIVE
Comment: NEGATIVE
Comment: NORMAL
Neisseria Gonorrhea: NEGATIVE
Trichomonas: NEGATIVE

## 2024-03-10 MED ORDER — METRONIDAZOLE 500 MG PO TABS: 500.0000 mg | ORAL_TABLET | Freq: Two times a day (BID) | ORAL | 0 refills | Status: AC

## 2024-05-14 ENCOUNTER — Telehealth: Admitting: Physician Assistant

## 2024-05-14 DIAGNOSIS — N760 Acute vaginitis: Secondary | ICD-10-CM | POA: Diagnosis not present

## 2024-05-14 DIAGNOSIS — B9689 Other specified bacterial agents as the cause of diseases classified elsewhere: Secondary | ICD-10-CM | POA: Diagnosis not present

## 2024-05-14 MED ORDER — METRONIDAZOLE 500 MG PO TABS: 500.0000 mg | ORAL_TABLET | Freq: Two times a day (BID) | ORAL | 0 refills | Status: AC

## 2024-05-14 NOTE — Progress Notes (Signed)

## 2024-05-15 ENCOUNTER — Telehealth

## 2024-05-29 ENCOUNTER — Telehealth: Admitting: Family Medicine

## 2024-05-29 DIAGNOSIS — R6889 Other general symptoms and signs: Secondary | ICD-10-CM

## 2024-05-29 DIAGNOSIS — R059 Cough, unspecified: Secondary | ICD-10-CM

## 2024-05-29 MED ORDER — PREDNISONE 10 MG (21) PO TBPK: ORAL_TABLET | ORAL | 0 refills | Status: DC

## 2024-05-29 MED ORDER — BENZONATATE 100 MG PO CAPS: 100.0000 mg | ORAL_CAPSULE | Freq: Three times a day (TID) | ORAL | 0 refills | Status: DC | PRN

## 2024-05-29 NOTE — Progress Notes (Signed)
 " Virtual Visit Consent   [name], you are scheduled for a virtual visit with a [facility] provider today. Just as with appointments in the office, your consent must be obtained to participate. Your consent will be active for this visit and any virtual visit you may have with one of our providers in the next 365 days. If you have a MyChart account, a copy of this consent can be sent to you electronically.  As this is a virtual visit, video technology does not allow for your provider to perform a traditional examination. This may limit your provider's ability to fully assess your condition. If your provider identifies any concerns that need to be evaluated in person or the need to arrange testing (such as labs, EKG, etc.), we will make arrangements to do so. Although advances in technology are sophisticated, we cannot ensure that it will always work on either your end or our end. If the connection with a video visit is poor, the visit may have to be switched to a telephone visit. With either a video or telephone visit, we are not always able to ensure that we have a secure connection.  By engaging in this virtual visit, you consent to the provision of healthcare and authorize for your insurance to be billed (if applicable) for the services provided during this visit. Depending on your insurance coverage, you may receive a charge related to this service.  I need to obtain your verbal consent now. Are you willing to proceed with your visit today? [name] has provided verbal consent on 05/29/2024 for a virtual visit (video or telephone). Gabriella Ingram, [place]  Date: 05/29/2024 3:24 PM   Virtual Visit via Video Note   I, Gabriella Ingram, connected with  Gabriella Ingram  ([id number], Mar 11, 1995) on 05/29/24 at  3:15 PM EST by a video-enabled telemedicine application and verified that I am speaking with the correct person using two identifiers.  Location: Patient: Virtual Visit Location Patient:  Home Provider: Virtual Visit Location Provider: Home Office   I discussed the limitations of evaluation and management by telemedicine and the availability of in person appointments. The patient expressed understanding and agreed to proceed.    History of Present Illness: [name] is a 24 y.o. who identifies as a female who was assigned female at birth, and is being seen today for c/o recovering from the Flu.  Pt states she was not tested for the flu but was around several people sick with the Flu. Pt states she woke up with rattling breathing and coughing.  Pt states she just wanted to make sure if that is normal. Pt denies shortness of breath or difficulty breathing. Pt denies being treated for the flu.  Pt states symptoms started on Tuesday.  Pt states taking over the counter medications to help with symptoms.  Pt states she is coughing up green color mucus that may vary with dark yellow.  Pt states taking theraflu.  Pt states today is the best day since her symptoms started.   HPI: HPI  Problems:  Patient Active Problem List   Diagnosis Date Noted   Bacterial vaginitis 06/28/2021   Candida vaginitis 06/28/2021   Anxiety and depression 03/08/2021   Preterm premature rupture of membranes (PPROM) with unknown onset of labor 11/08/2018   Leukocytes in urine 10/09/2018   Rash 09/03/2018   Supervision of other normal pregnancy, antepartum 07/24/2018   Chlamydia infection affecting pregnancy 07/24/2018   Dichorionic diamniotic twin gestation 07/02/2018  Allergies: Allergies[1] Medications: Current Medications[2]  Observations/Objective: Patient is well-developed, well-nourished in no acute distress.  Resting comfortably  at home.  Head is normocephalic, atraumatic.  No labored breathing.  Speech is clear and coherent with logical content.  Patient is alert and oriented at baseline.    Assessment and Plan: 1. Cough, unspecified type (Primary) - benzonatate  (TESSALON ) 100 MG  capsule; Take 1 capsule (100 mg total) by mouth 3 (three) times daily as needed for up to 7 days.  Dispense: 21 capsule; Refill: 0 - predniSONE  (STERAPRED UNI-PAK 21 TAB) 10 MG (21) TBPK tablet; Take following package directions.  Dispense: 21 tablet; Refill: 0  2. Flu-like symptoms  -Pt advised to follow up in person with urgent care or PCP for persistent or worsening symptoms   Follow Up Instructions: I discussed the assessment and treatment plan with the patient. The patient was provided an opportunity to ask questions and all were answered. The patient agreed with the plan and demonstrated an understanding of the instructions.  A copy of instructions were sent to the patient via MyChart unless otherwise noted below.    The patient was advised to call back or seek an in-person evaluation if the symptoms worsen or if the condition fails to improve as anticipated.    Gabriella Mater, PA-C    [1]  Allergies Allergen Reactions   Other     Food allergy -tomatoes- rash, hives, SOB    Tomato Itching  [2]  Current Outpatient Medications:    benzonatate  (TESSALON ) 100 MG capsule, Take 1 capsule (100 mg total) by mouth 3 (three) times daily as needed for up to 7 days., Disp: 21 capsule, Rfl: 0   predniSONE  (STERAPRED UNI-PAK 21 TAB) 10 MG (21) TBPK tablet, Take following package directions., Disp: 21 tablet, Rfl: 0  "

## 2024-05-29 NOTE — Patient Instructions (Signed)
 " Gabriella Ingram, thank you for joining Roosvelt Mater, PA-C for today's virtual visit.  While this provider is not your primary care provider (PCP), if your PCP is located in our provider database this encounter information will be shared with them immediately following your visit.   A [facility] MyChart account gives you access to today's visit and all your visits, tests, and labs performed at [facility]  click here if you don't have a [facility] MyChart account or go to [email or web address]  Consent: (Patient) Gabriella Ingram provided verbal consent for this virtual visit at the beginning of the encounter.  Current Medications:  Current Outpatient Medications:    benzonatate  (TESSALON ) 100 MG capsule, Take 1 capsule (100 mg total) by mouth 3 (three) times daily as needed for up to 7 days., Disp: 21 capsule, Rfl: 0   predniSONE  (STERAPRED UNI-PAK 21 TAB) 10 MG (21) TBPK tablet, Take following package directions., Disp: 21 tablet, Rfl: 0   Medications ordered in this encounter:  Meds ordered this encounter  Medications   benzonatate  (TESSALON ) 100 MG capsule    Sig: Take 1 capsule (100 mg total) by mouth 3 (three) times daily as needed for up to 7 days.    Dispense:  21 capsule    Refill:  0   predniSONE  (STERAPRED UNI-PAK 21 TAB) 10 MG (21) TBPK tablet    Sig: Take following package directions.    Dispense:  21 tablet    Refill:  0    Please dispense one standard blister pack taper.     *If you need refills on other medications prior to your next appointment, please contact your pharmacy*  Follow-Up: Call back or seek an in-person evaluation if the symptoms worsen or if the condition fails to improve as anticipated.  [facility] [phone]  Other Instructions Cough, Adult Coughing is a reflex that clears your throat and airways (respiratory system). It helps heal and protect your lungs. It is normal to cough from time to time. A cough that happens  with other symptoms or that lasts a long time may be a sign of a condition that needs treatment. A short-term (acute) cough may only last 2-3 weeks. A long-term (chronic) cough may last 8 or more weeks. Coughing is often caused by: Diseases, such as: An infection of the respiratory system. Asthma or other heart or lung diseases. Gastroesophageal reflux. This is when acid comes back up from the stomach. Breathing in things that irritate your lungs. Allergies. Postnasal drip. This is when mucus runs down the back of your throat. Smoking. Some medicines. Follow these instructions at home: Medicines Take over-the-counter and prescription medicines only as told by your health care provider. Talk with your provider before you take cough medicine (cough suppressants). Eating and drinking Do not drink alcohol. Avoid caffeine. Drink enough fluid to keep your pee (urine) pale yellow. Lifestyle Avoid cigarette smoke. Do not use any products that contain nicotine or tobacco. These products include cigarettes, chewing tobacco, and vaping devices, such as e-cigarettes. If you need help quitting, ask your provider. Avoid things that make you cough. These may include perfumes, candles, cleaning products, or campfire smoke. General instructions  Watch for any changes to your cough. Tell your provider about them. Always cover your mouth when you cough. If the air is dry in your bedroom or home, use a cool mist vaporizer or humidifier. If your cough is worse at night, try to sleep in a semi-upright position. Rest as  needed. Contact a health care provider if: You have new symptoms, or your symptoms get worse. You cough up pus. You have a fever that does not go away or a cough that does not get better after 2-3 weeks. You cannot control your cough with medicine, and you are losing sleep. You have pain that gets worse or is not helped with medicine. You lose weight for no clear reason. You have night  sweats. Get help right away if: You cough up blood. You have trouble breathing. Your heart is beating very fast. These symptoms may be an emergency. Get help right away. Call 911. Do not wait to see if the symptoms will go away. Do not drive yourself to the hospital. This information is not intended to replace advice given to you by your health care provider. Make sure you discuss any questions you have with your health care provider. Document Revised: 12/28/2021 Document Reviewed: 12/28/2021 Elsevier Patient Education  2024 [employer]   If you have been instructed to have an in-person evaluation today at a local Urgent Care facility, please use the link below. It will take you to a list of all of our available [facility], including address, phone number and hours of operation. Please do not delay care.  [facility]  If you or a family member do not have a primary care provider, use the link below to schedule a visit and establish care. When you choose a [facility] primary care physician or advanced practice provider, you gain a long-term partner in health. Find a Primary Care Provider  Learn more about [facility] in-office and virtual care options: [facility] - Get Care Now  "

## 2024-06-04 ENCOUNTER — Encounter: Payer: Self-pay | Admitting: Emergency Medicine

## 2024-06-04 ENCOUNTER — Ambulatory Visit (INDEPENDENT_AMBULATORY_CARE_PROVIDER_SITE_OTHER)

## 2024-06-04 ENCOUNTER — Ambulatory Visit: Admission: EM | Admit: 2024-06-04 | Discharge: 2024-06-04 | Disposition: A | Discharge status: Home / Self Care

## 2024-06-04 ENCOUNTER — Other Ambulatory Visit: Payer: Self-pay

## 2024-06-04 DIAGNOSIS — R059 Cough, unspecified: Secondary | ICD-10-CM

## 2024-06-04 DIAGNOSIS — J209 Acute bronchitis, unspecified: Secondary | ICD-10-CM | POA: Diagnosis not present

## 2024-06-04 MED ORDER — PREDNISONE 50 MG PO TABS: ORAL_TABLET | ORAL | 0 refills | Status: AC

## 2024-06-04 MED ORDER — GUAIFENESIN ER 600 MG PO TB12: 600.0000 mg | ORAL_TABLET | Freq: Two times a day (BID) | ORAL | 0 refills | Status: AC

## 2024-06-04 MED ORDER — ALBUTEROL SULFATE HFA 108 (90 BASE) MCG/ACT IN AERS: 2.0000 | INHALATION_SPRAY | RESPIRATORY_TRACT | 0 refills | Status: AC | PRN

## 2024-06-04 MED ORDER — PROMETHAZINE-DM 6.25-15 MG/5ML PO SYRP: 5.0000 mL | ORAL_SOLUTION | Freq: Four times a day (QID) | ORAL | 0 refills | Status: AC | PRN

## 2024-06-04 NOTE — ED Provider Notes (Signed)
 " [facility]    CSN: [id number] Arrival date & time: 06/04/24  0825      History   Chief Complaint Chief Complaint  Patient presents with   Cough   Sore Throat   Fatigue    HPI Gabriella Ingram is a 24 y.o. female.   Pt presents today due to productive cough for the past 10-11 days.Pt states that her children have been sick as well. Pt denies states that she has been using Theraflu for symptoms with no significant relief. Pt states that she is experiencing worsening symptoms when laying flat at night. Pt states that she is experiencing wheezing and crackling in lungs.   The history is provided by the patient.  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    Patient Active Problem List   Diagnosis Date Noted   Bacterial vaginitis 06/28/2021   Candida vaginitis 06/28/2021   Anxiety and depression 03/08/2021   Preterm premature rupture of membranes (PPROM) with unknown onset of labor 11/08/2018   Leukocytes in urine 10/09/2018   Rash 09/03/2018   Supervision of other normal pregnancy, antepartum 07/24/2018   Chlamydia infection affecting pregnancy 07/24/2018   Dichorionic diamniotic twin gestation 07/02/2018    Past Surgical History:  Procedure Laterality Date   NO PAST SURGERIES      OB History     Gravida  1   Para  1   Term      Preterm  1   AB      Living  2      SAB      IAB      Ectopic      Multiple  1   Live Births  2            Home Medications    Prior to Admission medications  Medication Sig Start Date End Date Taking? Authorizing Provider  albuterol (VENTOLIN HFA) 108 (90 Base) MCG/ACT inhaler Inhale 2 puffs into the lungs every 4 (four) hours as needed for wheezing or shortness of breath. 06/04/24  Yes Andra Corean BROCKS, PA-C  guaiFENesin (MUCINEX) 600 MG 12 hr tablet Take 1 tablet (600 mg total) by mouth 2 (two) times daily  for 10 days. 06/04/24 06/14/24 Yes Andra Corean BROCKS, PA-C  predniSONE  (DELTASONE ) 50 MG tablet Take 1 tab po daily for 5 days 06/04/24  Yes Andra Corean BROCKS, PA-C  promethazine-dextromethorphan (PROMETHAZINE-DM) 6.25-15 MG/5ML syrup Take 5 mLs by mouth 4 (four) times daily as needed. 06/04/24  Yes Andra Corean BROCKS, PA-C    Family History Family History  Problem Relation Age of Onset   Anxiety disorder Mother    Depression Mother    Cancer Mother    Miscarriages / Stillbirths Mother    Arthritis Maternal Grandmother    Hypertension Maternal Grandmother    Arthritis Maternal Grandfather    Diabetes Maternal Grandfather    Cancer Maternal Grandfather    Hypertension Maternal Grandfather     Social History Social History[1]   Allergies   Other and Tomato   Review of Systems Review of Systems  Respiratory:  Positive for cough.      Physical Exam Triage Vital Signs ED Triage Vitals  Encounter Vitals Group     BP 06/04/24 0830 (!) 143/92     Girls Systolic BP Percentile --      Girls Diastolic BP Percentile --  Boys Systolic BP Percentile --      Boys Diastolic BP Percentile --      Pulse Rate 06/04/24 0830 77     Resp 06/04/24 0830 18     Temp 06/04/24 0830 97.8 F (36.6 C)     Temp Source 06/04/24 0830 Oral     SpO2 06/04/24 0830 96 %     Weight --      Height --      Head Circumference --      Peak Flow --      Pain Score 06/04/24 0838 0     Pain Loc --      Pain Education --      Exclude from Growth Chart --    No data found.  Updated Vital Signs BP (!) 143/92 (BP Location: Left Arm)   Pulse 77   Temp 97.8 F (36.6 C) (Oral)   Resp 18   LMP 05/19/2024 (Approximate)   SpO2 96%   Visual Acuity Right Eye Distance:   Left Eye Distance:   Bilateral Distance:    Right Eye Near:   Left Eye Near:    Bilateral Near:     Physical Exam Vitals and nursing note reviewed.  Constitutional:      General: She is not in acute distress.     Appearance: She is well-developed. She is not ill-appearing, toxic-appearing or diaphoretic.  Eyes:     General: No scleral icterus. Cardiovascular:     Rate and Rhythm: Normal rate and regular rhythm.     Heart sounds: Normal heart sounds.  Pulmonary:     Effort: Pulmonary effort is normal. No respiratory distress.     Breath sounds: Examination of the right-upper field reveals wheezing and rhonchi. Examination of the left-upper field reveals wheezing and rhonchi. Examination of the right-lower field reveals wheezing and rhonchi. Examination of the left-lower field reveals wheezing and rhonchi. Wheezing and rhonchi present.     Comments: Wheezes and rhonchi Skin:    General: Skin is warm.  Neurological:     Mental Status: She is alert and oriented to person, place, and time.  Psychiatric:        Mood and Affect: Mood normal.        Behavior: Behavior normal.    1 1    UC Treatments / Results  Labs (all labs ordered are listed, but only abnormal results are displayed) Labs Reviewed - No data to display  EKG   Radiology DG Chest 2 View Result Date: 06/04/2024 EXAM: 2 VIEW(S) XRAY OF THE CHEST 06/04/2024 08:53:20 AM COMPARISON: None available. CLINICAL HISTORY: cough Cough. FINDINGS: LUNGS AND PLEURA: No focal pulmonary opacity. No pleural effusion. No pneumothorax. HEART AND MEDIASTINUM: No acute abnormality of the cardiac and mediastinal silhouettes. BONES AND SOFT TISSUES: No acute osseous abnormality. IMPRESSION: 1. No acute cardiopulmonary abnormality. Electronically signed by: Rogelia Myers MD 06/04/2024 08:56 AM EST RP Workstation: [id number]    Procedures Procedures (including critical care time)  Medications Ordered in UC Medications - No data to display  Initial Impression / Assessment and Plan / UC Course  I have reviewed the triage vital signs and the nursing notes.  Pertinent labs & imaging results that were available during my care of the patient were reviewed  by me and considered in my medical decision making (see chart for details).     Final Clinical Impressions(s) / UC Diagnoses   Final diagnoses:  Cough, unspecified type  Acute bronchitis, unspecified organism  Discharge Instructions      No signs of pneumonia noted on xray. I am believe you have acute bronchitis. I will send an albuterol inhaler, mucinex, promethazine-DM, and a short course of steroids.  If symptoms do not improve in 3-5 days please follow-up.     ED Prescriptions     Medication Sig Dispense Auth. Provider   guaiFENesin (MUCINEX) 600 MG 12 hr tablet Take 1 tablet (600 mg total) by mouth 2 (two) times daily for 10 days. 20 tablet Andra Krabbe C, PA-C   albuterol (VENTOLIN HFA) 108 (90 Base) MCG/ACT inhaler Inhale 2 puffs into the lungs every 4 (four) hours as needed for wheezing or shortness of breath. 8 g Madilyne Tadlock C, PA-C   predniSONE  (DELTASONE ) 50 MG tablet Take 1 tab po daily for 5 days 5 tablet Andra Krabbe BROCKS, PA-C   promethazine-dextromethorphan (PROMETHAZINE-DM) 6.25-15 MG/5ML syrup Take 5 mLs by mouth 4 (four) times daily as needed. 118 mL Andra Krabbe BROCKS, PA-C      PDMP not reviewed this encounter.    [1]  Social History Tobacco Use   Smoking status: Never    Passive exposure: Never   Smokeless tobacco: Never  Vaping Use   Vaping status: Never Used  Substance Use Topics   Alcohol use: No   Drug use: No     Andra Krabbe BROCKS, PA-C 06/04/24 1137  "

## 2024-06-04 NOTE — ED Triage Notes (Signed)
 Pt reports productive cough and nasal congestion x 10-11 days. Denies fevers. Sore throat associated with constant coughing. Cough has continued to get worse with rattling on expiration and intermittent wheezing. Pt had virtual visit last week and finished prescribed tapered prednisone  pack with no relief. Has also been taking theraflu

## 2024-06-04 NOTE — Discharge Instructions (Addendum)
 No signs of pneumonia noted on xray. I am believe you have acute bronchitis. I will send an albuterol inhaler, mucinex, promethazine-DM, and a short course of steroids.  If symptoms do not improve in 3-5 days please follow-up.

## 2024-07-13 ENCOUNTER — Encounter: Admitting: Family
